# Patient Record
Sex: Male | Born: 1975 | Race: White | Hispanic: No | Marital: Single | Smoking: Former smoker
Health system: Southern US, Community
[De-identification: ages and names within clinical notes are randomized; demographics above are authoritative.]

## PROBLEM LIST (undated history)

## (undated) ENCOUNTER — Encounter

## (undated) ENCOUNTER — Ambulatory Visit

## (undated) ENCOUNTER — Telehealth

## (undated) ENCOUNTER — Encounter: Attending: Physician Assistant | Primary: Physician Assistant

## (undated) ENCOUNTER — Encounter
Attending: Student in an Organized Health Care Education/Training Program | Primary: Student in an Organized Health Care Education/Training Program

## (undated) ENCOUNTER — Encounter
Attending: Pharmacist Clinician (PhC)/ Clinical Pharmacy Specialist | Primary: Pharmacist Clinician (PhC)/ Clinical Pharmacy Specialist

## (undated) ENCOUNTER — Encounter: Attending: Critical Care Medicine | Primary: Critical Care Medicine

## (undated) ENCOUNTER — Ambulatory Visit: Attending: Otolaryngology | Primary: Otolaryngology

## (undated) ENCOUNTER — Telehealth
Attending: Student in an Organized Health Care Education/Training Program | Primary: Student in an Organized Health Care Education/Training Program

## (undated) ENCOUNTER — Telehealth: Attending: Otolaryngology | Primary: Otolaryngology

## (undated) ENCOUNTER — Ambulatory Visit: Attending: Physician Assistant | Primary: Physician Assistant

## (undated) ENCOUNTER — Ambulatory Visit
Attending: Pharmacist Clinician (PhC)/ Clinical Pharmacy Specialist | Primary: Pharmacist Clinician (PhC)/ Clinical Pharmacy Specialist

## (undated) ENCOUNTER — Encounter: Attending: Anesthesiology | Primary: Anesthesiology

## (undated) ENCOUNTER — Telehealth: Attending: Critical Care Medicine | Primary: Critical Care Medicine

## (undated) ENCOUNTER — Ambulatory Visit: Payer: PRIVATE HEALTH INSURANCE

## (undated) ENCOUNTER — Ambulatory Visit: Attending: Pharmacist | Primary: Pharmacist

## (undated) ENCOUNTER — Telehealth: Attending: Urology | Primary: Urology

## (undated) DIAGNOSIS — E119 Type 2 diabetes mellitus without complications: Secondary | ICD-10-CM

## (undated) HISTORY — PX: NASAL SINUS SURGERY: SHX719

---

## 2004-03-14 ENCOUNTER — Emergency Department: Payer: Self-pay | Admitting: Emergency Medicine

## 2004-07-01 ENCOUNTER — Other Ambulatory Visit: Payer: Self-pay

## 2004-07-01 ENCOUNTER — Emergency Department: Payer: Self-pay | Admitting: Emergency Medicine

## 2004-08-23 ENCOUNTER — Inpatient Hospital Stay (HOSPITAL_COMMUNITY): Admission: EM | Admit: 2004-08-23 | Discharge: 2004-08-27 | Payer: Self-pay

## 2005-07-14 ENCOUNTER — Emergency Department: Payer: Self-pay | Admitting: Emergency Medicine

## 2005-07-14 ENCOUNTER — Other Ambulatory Visit: Payer: Self-pay

## 2005-09-26 ENCOUNTER — Emergency Department: Payer: Self-pay | Admitting: Emergency Medicine

## 2006-03-04 ENCOUNTER — Other Ambulatory Visit: Payer: Self-pay

## 2006-03-04 ENCOUNTER — Emergency Department: Payer: Self-pay | Admitting: Emergency Medicine

## 2006-05-05 ENCOUNTER — Other Ambulatory Visit: Payer: Self-pay

## 2006-05-05 ENCOUNTER — Emergency Department: Payer: Self-pay | Admitting: Emergency Medicine

## 2006-08-08 ENCOUNTER — Other Ambulatory Visit: Payer: Self-pay

## 2006-08-08 ENCOUNTER — Emergency Department: Payer: Self-pay

## 2010-11-15 ENCOUNTER — Emergency Department: Payer: Self-pay | Admitting: Emergency Medicine

## 2011-11-16 ENCOUNTER — Ambulatory Visit: Payer: Self-pay | Admitting: Physical Medicine and Rehabilitation

## 2018-05-29 ENCOUNTER — Ambulatory Visit
Admit: 2018-05-29 | Discharge: 2018-05-30 | Attending: Student in an Organized Health Care Education/Training Program | Primary: Student in an Organized Health Care Education/Training Program

## 2018-05-29 DIAGNOSIS — H00019 Hordeolum externum unspecified eye, unspecified eyelid: Principal | ICD-10-CM

## 2018-06-30 ENCOUNTER — Institutional Professional Consult (permissible substitution): Admit: 2018-06-30 | Discharge: 2018-07-01

## 2018-06-30 DIAGNOSIS — E119 Type 2 diabetes mellitus without complications: Secondary | ICD-10-CM

## 2018-06-30 DIAGNOSIS — E1140 Type 2 diabetes mellitus with diabetic neuropathy, unspecified: Secondary | ICD-10-CM

## 2018-06-30 DIAGNOSIS — N489 Disorder of penis, unspecified: Principal | ICD-10-CM

## 2018-07-24 ENCOUNTER — Ambulatory Visit: Admit: 2018-07-24 | Discharge: 2018-07-25 | Attending: Urology | Primary: Urology

## 2018-07-24 DIAGNOSIS — N489 Disorder of penis, unspecified: Principal | ICD-10-CM

## 2018-11-02 ENCOUNTER — Encounter
Admit: 2018-11-02 | Discharge: 2018-11-03 | Disposition: A | Discharge status: Home / Self Care | Payer: Worker's Compensation

## 2018-11-03 MED ORDER — FLUTICASONE PROPIONATE 50 MCG/ACTUATION NASAL SPRAY,SUSPENSION
Freq: Two times a day (BID) | NASAL | 0 refills | 0 days | Status: CP
Start: 2018-11-03 — End: 2019-11-03

## 2018-11-03 MED ORDER — DOXYCYCLINE HYCLATE 100 MG CAPSULE
ORAL_CAPSULE | Freq: Two times a day (BID) | ORAL | 0 refills | 21 days | Status: CP
Start: 2018-11-03 — End: 2018-11-24

## 2018-11-03 MED ORDER — OCEAN NASAL 0.65 % SPRAY AEROSOL
Freq: Once | NASAL | 12 refills | 75.00000 days | Status: CP
Start: 2018-11-03 — End: 2018-11-03

## 2018-11-05 ENCOUNTER — Ambulatory Visit: Admit: 2018-11-05 | Discharge: 2018-11-06

## 2018-11-05 ENCOUNTER — Ambulatory Visit: Admit: 2018-11-05 | Discharge: 2018-11-06 | Attending: Family | Primary: Family

## 2018-11-06 ENCOUNTER — Telehealth: Admit: 2018-11-06 | Discharge: 2018-11-07

## 2018-11-06 DIAGNOSIS — G4733 Obstructive sleep apnea (adult) (pediatric): Principal | ICD-10-CM

## 2018-11-06 DIAGNOSIS — E119 Type 2 diabetes mellitus without complications: Principal | ICD-10-CM

## 2018-11-06 MED ORDER — LIRAGLUTIDE 0.6 MG/0.1 ML (18 MG/3 ML) SUBCUTANEOUS PEN INJECTOR
Freq: Every day | SUBCUTANEOUS | 11 refills | 60.00000 days | Status: CP
Start: 2018-11-06 — End: 2019-11-06
  Filled 2018-11-10: qty 6, 60d supply, fill #0

## 2018-11-06 MED ORDER — PEN NEEDLE, DIABETIC 32 GAUGE X 1/4" (6 MM)
5 refills | 0 days
Start: 2018-11-06 — End: ?

## 2018-11-10 MED FILL — NOVOFINE 32 32 GAUGE X 1/4" NEEDLE (6 MM): 100 days supply | Qty: 100 | Fill #0

## 2018-11-10 MED FILL — VICTOZA 2-PAK 0.6 MG/0.1 ML (18 MG/3 ML) SUBCUTANEOUS PEN INJECTOR: 60 days supply | Qty: 6 | Fill #0 | Status: AC

## 2018-11-10 MED FILL — NOVOFINE 32 32 GAUGE X 1/4" NEEDLE: 100 days supply | Qty: 100 | Fill #0 | Status: AC

## 2018-11-20 ENCOUNTER — Telehealth: Admit: 2018-11-20 | Discharge: 2018-11-21

## 2018-11-20 MED ORDER — MISCELLANEOUS MEDICAL SUPPLY MISC
0 refills | 0 days | Status: CP
Start: 2018-11-20 — End: ?

## 2018-11-21 ENCOUNTER — Telehealth: Admit: 2018-11-21 | Discharge: 2018-11-22 | Attending: Physician Assistant | Primary: Physician Assistant

## 2018-11-26 MED ORDER — ALBUTEROL SULFATE HFA 90 MCG/ACTUATION AEROSOL INHALER
Freq: Four times a day (QID) | RESPIRATORY_TRACT | 5 refills | 0.00000 days | Status: CP | PRN
Start: 2018-11-26 — End: 2019-11-26

## 2018-11-28 ENCOUNTER — Ambulatory Visit: Admit: 2018-11-28 | Discharge: 2018-11-29

## 2018-12-02 MED ORDER — ATORVASTATIN 40 MG TABLET
ORAL_TABLET | Freq: Every day | ORAL | 3 refills | 90 days | Status: CP
Start: 2018-12-02 — End: 2019-12-02

## 2018-12-10 ENCOUNTER — Ambulatory Visit: Admit: 2018-12-10 | Discharge: 2018-12-11 | Attending: Otolaryngology | Primary: Otolaryngology

## 2018-12-10 DIAGNOSIS — J329 Chronic sinusitis, unspecified: Principal | ICD-10-CM

## 2018-12-10 DIAGNOSIS — J309 Allergic rhinitis, unspecified: Principal | ICD-10-CM

## 2018-12-10 MED ORDER — FEXOFENADINE 180 MG TABLET
ORAL_TABLET | Freq: Every day | ORAL | 11 refills | 0 days | Status: CP
Start: 2018-12-10 — End: 2019-12-10

## 2018-12-10 MED ORDER — FLUTICASONE PROPIONATE 50 MCG/ACTUATION NASAL SPRAY,SUSPENSION
Freq: Two times a day (BID) | NASAL | 12 refills | 0.00000 days | Status: CP
Start: 2018-12-10 — End: 2019-01-09

## 2018-12-11 ENCOUNTER — Ambulatory Visit: Admit: 2018-12-11 | Discharge: 2018-12-12 | Attending: Critical Care Medicine | Primary: Critical Care Medicine

## 2018-12-12 ENCOUNTER — Telehealth: Admit: 2018-12-12 | Discharge: 2018-12-13

## 2018-12-12 DIAGNOSIS — F411 Generalized anxiety disorder: Principal | ICD-10-CM

## 2018-12-12 DIAGNOSIS — U071 COVID-19 virus infection: Principal | ICD-10-CM

## 2018-12-12 MED ORDER — BUSPIRONE 5 MG TABLET
ORAL_TABLET | Freq: Three times a day (TID) | ORAL | 0 refills | 30 days | Status: CP | PRN
Start: 2018-12-12 — End: 2019-01-11

## 2018-12-14 ENCOUNTER — Emergency Department
Admission: EM | Admit: 2018-12-14 | Discharge: 2018-12-14 | Disposition: A | Discharge status: Home / Self Care | Payer: Self-pay | Attending: Emergency Medicine | Admitting: Emergency Medicine

## 2018-12-14 ENCOUNTER — Other Ambulatory Visit: Payer: Self-pay

## 2018-12-14 ENCOUNTER — Emergency Department: Payer: Self-pay

## 2018-12-14 DIAGNOSIS — F419 Anxiety disorder, unspecified: Secondary | ICD-10-CM

## 2018-12-14 DIAGNOSIS — R0981 Nasal congestion: Secondary | ICD-10-CM | POA: Insufficient documentation

## 2018-12-14 DIAGNOSIS — U071 COVID-19: Secondary | ICD-10-CM

## 2018-12-14 DIAGNOSIS — E119 Type 2 diabetes mellitus without complications: Secondary | ICD-10-CM | POA: Insufficient documentation

## 2018-12-14 DIAGNOSIS — R5383 Other fatigue: Secondary | ICD-10-CM

## 2018-12-14 DIAGNOSIS — Z87891 Personal history of nicotine dependence: Secondary | ICD-10-CM | POA: Insufficient documentation

## 2018-12-14 HISTORY — DX: Type 2 diabetes mellitus without complications: E11.9

## 2018-12-14 LAB — COMPREHENSIVE METABOLIC PANEL
ALT: 22 U/L (ref 0–44)
AST: 15 U/L (ref 15–41)
Albumin: 4.4 g/dL (ref 3.5–5.0)
Alkaline Phosphatase: 63 U/L (ref 38–126)
Anion gap: 11 (ref 5–15)
BUN: 11 mg/dL (ref 6–20)
CO2: 24 mmol/L (ref 22–32)
Calcium: 9.1 mg/dL (ref 8.9–10.3)
Chloride: 102 mmol/L (ref 98–111)
Creatinine, Ser: 0.62 mg/dL (ref 0.61–1.24)
GFR calc Af Amer: >60 mL/min (ref >60)
GFR calc non Af Amer: >60 mL/min (ref >60)
Glucose, Bld: 180 mg/dL — ABNORMAL HIGH (ref 70–99)
Potassium: 3.5 mmol/L (ref 3.5–5.1)
Sodium: 137 mmol/L (ref 135–145)
Total Bilirubin: 1.2 mg/dL (ref 0.3–1.2)
Total Protein: 7.7 g/dL (ref 6.5–8.1)

## 2018-12-14 LAB — CBC WITH DIFFERENTIAL/PLATELET
Abs Immature Granulocytes: 0.06 10*3/uL (ref 0.00–0.07)
Basophils Absolute: 0.1 10*3/uL (ref 0.0–0.1)
Basophils Relative: 1 %
Eosinophils Absolute: 0.3 10*3/uL (ref 0.0–0.5)
Eosinophils Relative: 3 %
HCT: 42.3 % (ref 39.0–52.0)
Hemoglobin: 14.6 g/dL (ref 13.0–17.0)
Immature Granulocytes: 1 %
Lymphocytes Relative: 27 %
Lymphs Abs: 2.7 10*3/uL (ref 0.7–4.0)
MCH: 27.6 pg (ref 26.0–34.0)
MCHC: 34.5 g/dL (ref 30.0–36.0)
MCV: 80 fL (ref 80.0–100.0)
Monocytes Absolute: 0.6 10*3/uL (ref 0.1–1.0)
Monocytes Relative: 6 %
Neutro Abs: 6.1 10*3/uL (ref 1.7–7.7)
Neutrophils Relative %: 62 %
Platelets: 294 10*3/uL (ref 150–400)
RBC: 5.29 MIL/uL (ref 4.22–5.81)
RDW: 13 % (ref 11.5–15.5)
WBC: 9.8 10*3/uL (ref 4.0–10.5)
nRBC: 0 % (ref 0.0–0.2)

## 2018-12-14 MED ORDER — HYDROXYZINE HCL 25 MG PO TABS
25.0000 mg | ORAL_TABLET | Freq: Once | ORAL | Status: AC
  Administered 2018-12-14: 23:00:00 25 mg via ORAL
  Filled 2018-12-14: qty 1

## 2018-12-14 NOTE — Discharge Instructions (Addendum)
You were seen today for fatigue associated with COVID-19.  Your x-ray was negative for pneumonia.  Your labs were completely normal.  At this point we recommend rest and fluids.  If your symptoms persist you may follow-up with your PCP.  If they worsen we recommend you follow back up in the ER.

## 2018-12-14 NOTE — ED Triage Notes (Signed)
Pt comes EMS with covid positive dx 3 days ago. Pt comes in due to exhaustion. Pt has t2d. VSS with EMS. CBG 108.

## 2018-12-14 NOTE — ED Notes (Signed)
Pt states "man I feel terrible. I feel like I need to be on medicine right now. I need to lower my bp. I feel like I just ran a marathon." This RN explained that it should only be a few minutes before he gets to a room. Pt very impatient and again states "man I feel awful." Pt wheeled to behind the red line in waiting room.

## 2018-12-14 NOTE — ED Provider Notes (Signed)
[facility] Emergency Department Provider Note ____________________________________________  Time seen: 2145  I have reviewed the triage vital signs and the nursing notes.  HISTORY  Chief Complaint  Weakness   HPI Jimmy Atkins is a 43 y.o. male presents to the ER today with complaint of extreme fatigue.  He reports this started suddenly today around 3 PM.  He was diagnosed with Covid 3 days ago.  He reports chest congestion and mild shortness of breath.  He denies headache, runny nose, nasal congestion, ear pain, sore throat or loss of taste or smell.  He denies fever, chills or body aches.  He denies nausea, vomiting, diarrhea or rash. He denies chest pain, chest tightness or unilateral weakness. He called EMS to his home earlier in the day.  They checked his blood sugar which he reports it was 108.  They did not transport him to the ER.  He called EMS a few hours later for extreme fatigue.  He is not sure if this fatigue is coming from Covid, because he is anxious or because he started BuSpar 5 mg 3 times a day yesterday.  Past Medical History:  Diagnosis Date  . Diabetes mellitus without complication ([place])     There are no active problems to display for this patient.   History reviewed. No pertinent surgical history.  Prior to Admission medications   Not on File    Allergies Penicillins  History reviewed. No pertinent family history.  Social History Social History   Tobacco Use  . Smoking status: Former Smoker    Quit date: 2008    Years since quitting: 12.9  . Smokeless tobacco: Never Used  Substance Use Topics  . Alcohol use: Not Currently    Frequency: Never  . Drug use: Never    Review of Systems  Constitutional: Positive for extreme fatigue. Negative for fever, chills or body aches. ENT: Negative for runny nose, nasal congestion, ear pain, sore throat, loss of taste or smell. Cardiovascular: Negative for chest pain or chest  tightness. Respiratory: Positive for cough and SOB. Gastrointestinal: Negative for nausea, vomiting and diarrhea. Skin: Negative for rash. Neurological: Negative for headaches, focal weakness, tingling or numbness. Psych: Positive for anxiety. Denies depression. ____________________________________________  PHYSICAL EXAM:  VITAL SIGNS: ED Triage Vitals  Enc Vitals Group     BP 12/14/18 2042 (!) 157/82     Pulse Rate 12/14/18 2042 89     Resp 12/14/18 2042 18     Temp 12/14/18 2042 98.2 F (36.8 C)     Temp Source 12/14/18 2042 Oral     SpO2 12/14/18 2042 95 %     Weight 12/14/18 2040 (!) 420 lb (190.5 kg)     Height 12/14/18 2040 5\' 9"  (1.753 m)     Head Circumference --      Peak Flow --      Pain Score 12/14/18 2040 0     Pain Loc --      Pain Edu? --      Excl. in [place]? --     Constitutional: Alert and oriented. Obese, in no distress. Head: Normocephalic and atraumatic. Eyes: Conjunctivae are normal. PERRL. Normal extraocular movements Ears: Canals clear. TMs intact bilaterally. Nose: No congestion/rhinorrhea/epistaxis. Mouth/Throat: Mucous membranes are moist. No posterior pharynx erythema or exudate noted. Hematological/Lymphatic/Immunological: No cervical lymphadenopathy. Cardiovascular: Normal rate, regular rhythm.  Respiratory: Normal respiratory effort. No wheezes/rales/rhonchi. Gastrointestinal: Soft and nontender. No distention. Musculoskeletal: Strength 5/5 BUE/BLE. Neurologic:  Normal speech and  language. No gross focal neurologic deficits are appreciated. Skin:  Skin is warm, dry and intact. No rash noted. Psychiatric: Anxious ____________________________________________   LABS Recent Results (from the past 2160 hour(s))  CBC with Differential     Status: None   Collection Time: 12/14/18 10:48 PM  Result Value Ref Range   WBC 9.8 4.0 - 10.5 K/uL   RBC 5.29 4.22 - 5.81 MIL/uL   Hemoglobin 14.6 13.0 - 17.0 g/dL   HCT 64.4 03.4 - 74.2 %   MCV 80.0 80.0  - 100.0 fL   MCH 27.6 26.0 - 34.0 pg   MCHC 34.5 30.0 - 36.0 g/dL   RDW 59.5 63.8 - 75.6 %   Platelets 294 150 - 400 K/uL   nRBC 0.0 0.0 - 0.2 %   Neutrophils Relative % 62 %   Neutro Abs 6.1 1.7 - 7.7 K/uL   Lymphocytes Relative 27 %   Lymphs Abs 2.7 0.7 - 4.0 K/uL   Monocytes Relative 6 %   Monocytes Absolute 0.6 0.1 - 1.0 K/uL   Eosinophils Relative 3 %   Eosinophils Absolute 0.3 0.0 - 0.5 K/uL   Basophils Relative 1 %   Basophils Absolute 0.1 0.0 - 0.1 K/uL   Immature Granulocytes 1 %   Abs Immature Granulocytes 0.06 0.00 - 0.07 K/uL    Comment: Performed at [facility], [address] [place], [place] [address]  Comprehensive metabolic panel     Status: Abnormal   Collection Time: 12/14/18 10:48 PM  Result Value Ref Range   Sodium 137 135 - 145 mmol/L   Potassium 3.5 3.5 - 5.1 mmol/L   Chloride 102 98 - 111 mmol/L   CO2 24 22 - 32 mmol/L   Glucose, Bld 180 (H) 70 - 99 mg/dL   BUN 11 6 - 20 mg/dL   Creatinine, Ser 5.18 0.61 - 1.24 mg/dL   Calcium 9.1 8.9 - 84.1 mg/dL   Total Protein 7.7 6.5 - 8.1 g/dL   Albumin 4.4 3.5 - 5.0 g/dL   AST 15 15 - 41 U/L   ALT 22 0 - 44 U/L   Alkaline Phosphatase 63 38 - 126 U/L   Total Bilirubin 1.2 0.3 - 1.2 mg/dL   GFR calc non Af Amer >60 >60 mL/min   GFR calc Af Amer >60 >60 mL/min   Anion gap 11 5 - 15    Comment: Performed at [facility], [address], [place], [place] [address]    ____________________________________________   RADIOLOGY  Imaging Orders     DG Chest 1 View IMPRESSION:  No active cardiopulmonary disease.    ____________________________________________   INITIAL IMPRESSION / ASSESSMENT AND PLAN / ED COURSE  Fatigue, COVID 19 +, Anxiety:  Chest xray negative Labs unremarkable Hydroxyzine 25 mg PO x 1 in ER due to anxiety Reassurance provided Recommend rest, fluids Discussed self quarantine for 14 days Encouraged masking, frequent handwashing and social distancing even in  the home Follow up with PCP if symptoms persist, ER if symptoms worsen ____________________________________________  FINAL CLINICAL IMPRESSION(S) / ED DIAGNOSES  Final diagnoses:  Other fatigue  COVID-19  Anxiety  Nicki Reaper, NP     Lorre Munroe, NP 12/14/18 2332    Dionne Bucy, MD 12/15/18 [phone]

## 2018-12-14 NOTE — ED Notes (Signed)
ED Provider at bedside. 

## 2018-12-16 MED ORDER — LANCETS
Freq: Every day | 11 refills | 0 days | Status: CP
Start: 2018-12-16 — End: ?

## 2018-12-16 MED ORDER — FREESTYLE LITE STRIPS
ORAL_STRIP | Freq: Once | 11 refills | 0.00000 days | Status: CP
Start: 2018-12-16 — End: 2018-12-16

## 2018-12-20 ENCOUNTER — Encounter
Admit: 2018-12-20 | Discharge: 2018-12-21 | Disposition: A | Discharge status: Home / Self Care | Payer: Worker's Compensation

## 2018-12-20 ENCOUNTER — Ambulatory Visit
Admit: 2018-12-20 | Discharge: 2018-12-21 | Disposition: A | Discharge status: Home / Self Care | Payer: Worker's Compensation

## 2018-12-20 DIAGNOSIS — J189 Pneumonia, unspecified organism: Principal | ICD-10-CM

## 2018-12-20 DIAGNOSIS — R0789 Other chest pain: Principal | ICD-10-CM

## 2018-12-20 MED ORDER — AZITHROMYCIN 250 MG TABLET
ORAL_TABLET | Freq: Every day | ORAL | 0 refills | 6.00000 days | Status: CP
Start: 2018-12-20 — End: ?

## 2018-12-27 ENCOUNTER — Encounter
Admit: 2018-12-27 | Discharge: 2018-12-28 | Disposition: A | Discharge status: Home / Self Care | Payer: Worker's Compensation

## 2019-01-01 ENCOUNTER — Telehealth: Admit: 2019-01-01 | Discharge: 2019-01-02 | Attending: Internal Medicine | Primary: Internal Medicine

## 2019-01-01 DIAGNOSIS — J329 Chronic sinusitis, unspecified: Principal | ICD-10-CM

## 2019-01-01 DIAGNOSIS — E119 Type 2 diabetes mellitus without complications: Principal | ICD-10-CM

## 2019-01-01 MED ORDER — DOXYCYCLINE HYCLATE 100 MG TABLET
ORAL_TABLET | Freq: Two times a day (BID) | ORAL | 0 refills | 10.00000 days | Status: CP
Start: 2019-01-01 — End: 2019-01-11

## 2019-01-12 ENCOUNTER — Telehealth: Admit: 2019-01-12 | Discharge: 2019-01-13

## 2019-01-15 MED FILL — VICTOZA 2-PAK 0.6 MG/0.1 ML (18 MG/3 ML) SUBCUTANEOUS PEN INJECTOR: 60 days supply | Qty: 6 | Fill #0 | Status: AC

## 2019-01-15 MED FILL — VICTOZA 2-PAK 0.6 MG/0.1 ML (18 MG/3 ML) SUBCUTANEOUS PEN INJECTOR: SUBCUTANEOUS | 60 days supply | Qty: 6 | Fill #0

## 2019-01-21 ENCOUNTER — Ambulatory Visit: Admit: 2019-01-21 | Discharge: 2019-01-22 | Attending: Otolaryngology | Primary: Otolaryngology

## 2019-01-21 ENCOUNTER — Ambulatory Visit: Admit: 2019-01-21 | Discharge: 2019-01-22

## 2019-01-21 DIAGNOSIS — E119 Type 2 diabetes mellitus without complications: Principal | ICD-10-CM

## 2019-01-21 DIAGNOSIS — Z6841 Body Mass Index (BMI) 40.0 and over, adult: Secondary | ICD-10-CM

## 2019-01-21 DIAGNOSIS — R030 Elevated blood-pressure reading, without diagnosis of hypertension: Principal | ICD-10-CM

## 2019-01-21 DIAGNOSIS — G4733 Obstructive sleep apnea (adult) (pediatric): Principal | ICD-10-CM

## 2019-01-21 DIAGNOSIS — Z01818 Encounter for other preprocedural examination: Principal | ICD-10-CM

## 2019-01-30 DIAGNOSIS — J329 Chronic sinusitis, unspecified: Principal | ICD-10-CM

## 2019-01-30 DIAGNOSIS — Z6841 Body Mass Index (BMI) 40.0 and over, adult: Principal | ICD-10-CM

## 2019-01-30 DIAGNOSIS — G4733 Obstructive sleep apnea (adult) (pediatric): Principal | ICD-10-CM

## 2019-02-02 MED ORDER — METFORMIN 1,000 MG TABLET
ORAL_TABLET | Freq: Two times a day (BID) | ORAL | 0 refills | 90 days | Status: CP
Start: 2019-02-02 — End: ?

## 2019-02-03 ENCOUNTER — Telehealth: Admit: 2019-02-03 | Discharge: 2019-02-04

## 2019-02-04 ENCOUNTER — Institutional Professional Consult (permissible substitution): Admit: 2019-02-04 | Discharge: 2019-02-05 | Attending: Otolaryngology | Primary: Otolaryngology

## 2019-02-04 DIAGNOSIS — R0981 Nasal congestion: Principal | ICD-10-CM

## 2019-02-05 ENCOUNTER — Ambulatory Visit: Admit: 2019-02-05 | Discharge: 2019-02-05

## 2019-02-09 ENCOUNTER — Ambulatory Visit: Admit: 2019-02-09 | Discharge: 2019-02-10

## 2019-02-10 DIAGNOSIS — J0101 Acute recurrent maxillary sinusitis: Principal | ICD-10-CM

## 2019-02-10 MED ORDER — DOXYCYCLINE HYCLATE 100 MG CAPSULE
ORAL_CAPSULE | Freq: Two times a day (BID) | ORAL | 0 refills | 7.00000 days | Status: CP
Start: 2019-02-10 — End: 2019-02-17
  Filled 2019-02-11: qty 14, 7d supply, fill #0

## 2019-02-10 MED ORDER — BLOOD-GLUCOSE METER
0 refills | 0 days | Status: CP
Start: 2019-02-10 — End: ?

## 2019-02-10 MED ORDER — DOXYCYCLINE HYCLATE 100 MG CAPSULE: 100 mg | capsule | Freq: Two times a day (BID) | 0 refills | 7 days | Status: AC

## 2019-02-11 MED FILL — DOXYCYCLINE HYCLATE 100 MG CAPSULE: 7 days supply | Qty: 14 | Fill #0 | Status: AC

## 2019-02-25 ENCOUNTER — Institutional Professional Consult (permissible substitution): Admit: 2019-02-25 | Discharge: 2019-02-26

## 2019-02-25 MED ORDER — LIRAGLUTIDE 0.6 MG/0.1 ML (18 MG/3 ML) SUBCUTANEOUS PEN INJECTOR
Freq: Every day | SUBCUTANEOUS | 1 refills | 75.00000 days | Status: CP
Start: 2019-02-25 — End: 2020-02-25
  Filled 2019-03-09: qty 18, 90d supply, fill #0

## 2019-03-04 MED ORDER — MOMETASONE FUROATE (BULK) 100 % POWDER
3 refills | 0 days | Status: CP
Start: 2019-03-04 — End: ?

## 2019-03-06 ENCOUNTER — Ambulatory Visit: Admit: 2019-03-06 | Discharge: 2019-03-07 | Attending: Family | Primary: Family

## 2019-03-06 MED ORDER — METFORMIN 1,000 MG TABLET
ORAL_TABLET | Freq: Two times a day (BID) | ORAL | 0 refills | 180.00000 days
Start: 2019-03-06 — End: ?

## 2019-03-08 ENCOUNTER — Ambulatory Visit: Admit: 2019-03-08 | Discharge: 2019-03-10

## 2019-03-09 MED FILL — ULTICARE PEN NEEDLE 32 GAUGE X 1/4" (6 MM): 100 days supply | Qty: 100 | Fill #1

## 2019-03-09 MED FILL — ULTICARE PEN NEEDLE 32 GAUGE X 1/4" (6 MM): 100 days supply | Qty: 100 | Fill #1 | Status: AC

## 2019-03-09 MED FILL — VICTOZA 3-PAK 0.6 MG/0.1 ML (18 MG/3 ML) SUBCUTANEOUS PEN INJECTOR: 90 days supply | Qty: 18 | Fill #0 | Status: AC

## 2019-04-08 DIAGNOSIS — E119 Type 2 diabetes mellitus without complications: Principal | ICD-10-CM

## 2019-05-03 ENCOUNTER — Encounter
Admit: 2019-05-03 | Discharge: 2019-05-03 | Disposition: A | Discharge status: Home / Self Care | Payer: Worker's Compensation | Attending: Emergency Medicine

## 2019-05-03 DIAGNOSIS — N12 Tubulo-interstitial nephritis, not specified as acute or chronic: Principal | ICD-10-CM

## 2019-05-03 MED ORDER — CEPHALEXIN 500 MG CAPSULE
ORAL_CAPSULE | Freq: Four times a day (QID) | ORAL | 0 refills | 10 days | Status: CP
Start: 2019-05-03 — End: 2019-05-13

## 2019-06-03 ENCOUNTER — Ambulatory Visit: Admit: 2019-06-03 | Discharge: 2019-06-03

## 2019-06-03 ENCOUNTER — Ambulatory Visit: Admit: 2019-06-03 | Discharge: 2019-06-03 | Attending: Otolaryngology | Primary: Otolaryngology

## 2019-06-05 DIAGNOSIS — R0981 Nasal congestion: Principal | ICD-10-CM

## 2019-06-05 DIAGNOSIS — J3489 Other specified disorders of nose and nasal sinuses: Principal | ICD-10-CM

## 2019-06-10 DIAGNOSIS — G4733 Obstructive sleep apnea (adult) (pediatric): Principal | ICD-10-CM

## 2019-06-10 DIAGNOSIS — Z6841 Body Mass Index (BMI) 40.0 and over, adult: Principal | ICD-10-CM

## 2019-06-10 DIAGNOSIS — Z01812 Encounter for preprocedural laboratory examination: Principal | ICD-10-CM

## 2019-06-10 DIAGNOSIS — Z20822 Contact with and (suspected) exposure to covid-19: Principal | ICD-10-CM

## 2019-06-10 DIAGNOSIS — E119 Type 2 diabetes mellitus without complications: Principal | ICD-10-CM

## 2019-06-17 ENCOUNTER — Ambulatory Visit: Admit: 2019-06-17 | Discharge: 2019-06-18 | Attending: Family | Primary: Family

## 2019-06-19 ENCOUNTER — Encounter: Admit: 2019-06-19 | Discharge: 2019-06-20

## 2019-06-19 ENCOUNTER — Ambulatory Visit: Admit: 2019-06-19 | Discharge: 2019-06-20

## 2019-06-20 MED ORDER — DOXYCYCLINE HYCLATE 100 MG CAPSULE
ORAL_CAPSULE | Freq: Two times a day (BID) | ORAL | 0 refills | 14 days | Status: CP
Start: 2019-06-20 — End: 2019-07-04
  Filled 2019-06-20: qty 28, 14d supply, fill #0

## 2019-06-20 MED ORDER — OXYCODONE-ACETAMINOPHEN 5 MG-325 MG TABLET
ORAL_TABLET | ORAL | 0 refills | 3.00000 days | Status: CP | PRN
Start: 2019-06-20 — End: 2019-06-25
  Filled 2019-06-20: qty 15, 3d supply, fill #0

## 2019-06-20 MED FILL — OXYCODONE-ACETAMINOPHEN 5 MG-325 MG TABLET: 3 days supply | Qty: 15 | Fill #0 | Status: AC

## 2019-06-20 MED FILL — DOXYCYCLINE HYCLATE 100 MG CAPSULE: 14 days supply | Qty: 28 | Fill #0 | Status: AC

## 2019-06-22 DIAGNOSIS — Z09 Encounter for follow-up examination after completed treatment for conditions other than malignant neoplasm: Principal | ICD-10-CM

## 2019-06-24 ENCOUNTER — Ambulatory Visit: Admit: 2019-06-24 | Discharge: 2019-06-25 | Attending: Otolaryngology | Primary: Otolaryngology

## 2019-07-07 MED ORDER — LIRAGLUTIDE 0.6 MG/0.1 ML (18 MG/3 ML) SUBCUTANEOUS PEN INJECTOR
Freq: Every day | SUBCUTANEOUS | 1 refills | 90.00000 days | Status: CP
Start: 2019-07-07 — End: 2020-07-06
  Filled 2019-07-20: qty 6, 30d supply, fill #0

## 2019-07-08 DIAGNOSIS — E119 Type 2 diabetes mellitus without complications: Principal | ICD-10-CM

## 2019-07-09 ENCOUNTER — Ambulatory Visit: Admit: 2019-07-09 | Discharge: 2019-07-10

## 2019-07-14 ENCOUNTER — Ambulatory Visit: Admit: 2019-07-14 | Discharge: 2019-07-15

## 2019-07-14 DIAGNOSIS — R300 Dysuria: Principal | ICD-10-CM

## 2019-07-14 DIAGNOSIS — E119 Type 2 diabetes mellitus without complications: Principal | ICD-10-CM

## 2019-07-14 DIAGNOSIS — K625 Hemorrhage of anus and rectum: Principal | ICD-10-CM

## 2019-07-14 MED ORDER — ALBUTEROL SULFATE HFA 90 MCG/ACTUATION AEROSOL INHALER
Freq: Four times a day (QID) | RESPIRATORY_TRACT | 0 refills | 0.00000 days | Status: CP | PRN
Start: 2019-07-14 — End: 2020-07-13
  Filled 2019-07-20: qty 8.5, 25d supply, fill #0

## 2019-07-14 MED ORDER — METFORMIN 1,000 MG TABLET
ORAL_TABLET | Freq: Two times a day (BID) | ORAL | 3 refills | 90.00000 days | Status: CP
Start: 2019-07-14 — End: 2020-07-13
  Filled 2019-07-20: qty 60, 30d supply, fill #0

## 2019-07-17 MED ORDER — PEG-ELECTROLYTE SOLUTION 420 GRAM ORAL SOLUTION
0 refills | 0.00000 days | Status: CP
Start: 2019-07-17 — End: ?

## 2019-07-20 MED FILL — VICTOZA 2-PAK 0.6 MG/0.1 ML (18 MG/3 ML) SUBCUTANEOUS PEN INJECTOR: 30 days supply | Qty: 6 | Fill #0 | Status: AC

## 2019-07-20 MED FILL — ALBUTEROL SULFATE HFA 90 MCG/ACTUATION AEROSOL INHALER: 25 days supply | Qty: 8 | Fill #0 | Status: AC

## 2019-07-20 MED FILL — METFORMIN 1,000 MG TABLET: 30 days supply | Qty: 60 | Fill #0 | Status: AC

## 2019-07-21 MED FILL — PEG-ELECTROLYTE SOLUTION 420 GRAM ORAL SOLUTION: 1 days supply | Qty: 4000 | Fill #0 | Status: AC

## 2019-07-21 MED FILL — NOVOFINE 32 32 GAUGE X 1/4" NEEDLE (6 MM): 100 days supply | Qty: 100 | Fill #0 | Status: AC

## 2019-07-21 MED FILL — PEG-ELECTROLYTE SOLUTION 420 GRAM ORAL SOLUTION: 1 days supply | Qty: 4000 | Fill #0

## 2019-07-21 MED FILL — NOVOFINE 32 32 GAUGE X 1/4" NEEDLE (6 MM): 100 days supply | Qty: 100 | Fill #0

## 2019-07-29 ENCOUNTER — Telehealth: Admit: 2019-07-29 | Discharge: 2019-07-30 | Attending: Physician Assistant | Primary: Physician Assistant

## 2019-08-22 MED FILL — VICTOZA 2-PAK 0.6 MG/0.1 ML (18 MG/3 ML) SUBCUTANEOUS PEN INJECTOR: 30 days supply | Qty: 6 | Fill #1 | Status: AC

## 2019-08-22 MED FILL — VICTOZA 2-PAK 0.6 MG/0.1 ML (18 MG/3 ML) SUBCUTANEOUS PEN INJECTOR: SUBCUTANEOUS | 30 days supply | Qty: 6 | Fill #1

## 2019-09-09 ENCOUNTER — Ambulatory Visit: Admit: 2019-09-09 | Discharge: 2019-09-10

## 2019-09-22 ENCOUNTER — Encounter: Admit: 2019-09-22 | Discharge: 2019-09-22 | Attending: Certified Registered" | Primary: Certified Registered"

## 2019-09-22 ENCOUNTER — Ambulatory Visit: Admit: 2019-09-22 | Discharge: 2019-09-22

## 2019-09-22 MED ORDER — PEG-ELECTROLYTE SOLUTION 420 GRAM ORAL SOLUTION
Freq: Once | ORAL | 0 refills | 1.00000 days | Status: CP
Start: 2019-09-22 — End: 2019-09-22

## 2019-09-25 MED FILL — PEG-ELECTROLYTE SOLUTION 420 GRAM ORAL SOLUTION: 1 days supply | Qty: 4000 | Fill #0 | Status: AC

## 2019-09-25 MED FILL — PEG-ELECTROLYTE SOLUTION 420 GRAM ORAL SOLUTION: ORAL | 1 days supply | Qty: 4000 | Fill #0

## 2019-09-28 MED FILL — VICTOZA 2-PAK 0.6 MG/0.1 ML (18 MG/3 ML) SUBCUTANEOUS PEN INJECTOR: SUBCUTANEOUS | 30 days supply | Qty: 6 | Fill #2

## 2019-09-28 MED FILL — VICTOZA 2-PAK 0.6 MG/0.1 ML (18 MG/3 ML) SUBCUTANEOUS PEN INJECTOR: 30 days supply | Qty: 6 | Fill #2 | Status: AC

## 2019-10-01 MED FILL — METFORMIN 1,000 MG TABLET: 90 days supply | Qty: 180 | Fill #0 | Status: AC

## 2019-10-01 MED FILL — METFORMIN 1,000 MG TABLET: ORAL | 90 days supply | Qty: 180 | Fill #0

## 2019-10-13 ENCOUNTER — Ambulatory Visit: Admit: 2019-10-13 | Discharge: 2019-10-14

## 2019-10-13 DIAGNOSIS — R0600 Dyspnea, unspecified: Principal | ICD-10-CM

## 2019-10-13 DIAGNOSIS — J4520 Mild intermittent asthma, uncomplicated: Principal | ICD-10-CM

## 2019-10-13 DIAGNOSIS — E119 Type 2 diabetes mellitus without complications: Principal | ICD-10-CM

## 2019-10-13 DIAGNOSIS — J329 Chronic sinusitis, unspecified: Principal | ICD-10-CM

## 2019-10-13 MED ORDER — MOMETASONE FUROATE (BULK) 100 % POWDER
9 refills | 0 days | Status: CP
Start: 2019-10-13 — End: ?

## 2019-10-13 MED ORDER — ALBUTEROL SULFATE HFA 90 MCG/ACTUATION AEROSOL INHALER
Freq: Four times a day (QID) | RESPIRATORY_TRACT | 3 refills | 25.00000 days | Status: CP | PRN
Start: 2019-10-13 — End: 2020-10-12
  Filled 2019-10-23: qty 8.5, 25d supply, fill #0

## 2019-10-23 MED FILL — ALBUTEROL SULFATE HFA 90 MCG/ACTUATION AEROSOL INHALER: 25 days supply | Qty: 8 | Fill #0 | Status: AC

## 2019-11-04 MED FILL — VICTOZA 2-PAK 0.6 MG/0.1 ML (18 MG/3 ML) SUBCUTANEOUS PEN INJECTOR: 30 days supply | Qty: 6 | Fill #3 | Status: AC

## 2019-11-04 MED FILL — VICTOZA 2-PAK 0.6 MG/0.1 ML (18 MG/3 ML) SUBCUTANEOUS PEN INJECTOR: SUBCUTANEOUS | 30 days supply | Qty: 6 | Fill #3

## 2019-11-12 MED ORDER — PEN NEEDLE, DIABETIC 32 GAUGE X 1/4" (6 MM)
5 refills | 0 days | Status: CP
Start: 2019-11-12 — End: ?

## 2019-11-16 MED FILL — ULTICARE PEN NEEDLE 32 GAUGE X 1/4" (6 MM): 100 days supply | Qty: 100 | Fill #0

## 2019-11-16 MED FILL — ULTICARE PEN NEEDLE 32 GAUGE X 1/4" (6 MM): 100 days supply | Qty: 100 | Fill #0 | Status: AC

## 2019-11-21 MED ORDER — LIRAGLUTIDE 0.6 MG/0.1 ML (18 MG/3 ML) SUBCUTANEOUS PEN INJECTOR
Freq: Every day | SUBCUTANEOUS | 1 refills | 90.00000 days | Status: CP
Start: 2019-11-21 — End: 2019-11-23

## 2019-11-23 MED ORDER — LIRAGLUTIDE 0.6 MG/0.1 ML (18 MG/3 ML) SUBCUTANEOUS PEN INJECTOR
Freq: Every day | SUBCUTANEOUS | 1 refills | 90.00000 days | Status: CP
Start: 2019-11-23 — End: 2019-12-07
  Filled 2019-11-30: qty 6, 30d supply, fill #0

## 2019-11-30 MED FILL — VICTOZA 2-PAK 0.6 MG/0.1 ML (18 MG/3 ML) SUBCUTANEOUS PEN INJECTOR: 30 days supply | Qty: 6 | Fill #0 | Status: AC

## 2019-12-07 MED ORDER — DULAGLUTIDE 0.75 MG/0.5 ML SUBCUTANEOUS PEN INJECTOR
SUBCUTANEOUS | 3 refills | 84 days | Status: CP
Start: 2019-12-07 — End: 2020-03-06
  Filled 2020-01-05: qty 2, 28d supply, fill #0

## 2020-01-05 MED FILL — TRULICITY 0.75 MG/0.5 ML SUBCUTANEOUS PEN INJECTOR: 28 days supply | Qty: 2 | Fill #0 | Status: AC

## 2020-03-10 MED ORDER — DULAGLUTIDE 0.75 MG/0.5 ML SUBCUTANEOUS PEN INJECTOR
SUBCUTANEOUS | 3 refills | 84.00000 days | Status: CP
Start: 2020-03-10 — End: 2020-03-10
  Filled 2020-03-11: qty 2, 28d supply, fill #0

## 2020-03-10 MED FILL — METFORMIN 1,000 MG TABLET: ORAL | 90 days supply | Qty: 180 | Fill #1

## 2020-03-10 MED FILL — ALBUTEROL SULFATE HFA 90 MCG/ACTUATION AEROSOL INHALER: RESPIRATORY_TRACT | 25 days supply | Qty: 8.5 | Fill #0

## 2020-04-19 MED FILL — ALBUTEROL SULFATE HFA 90 MCG/ACTUATION AEROSOL INHALER: RESPIRATORY_TRACT | 25 days supply | Qty: 8.5 | Fill #1

## 2020-04-19 MED FILL — TRULICITY 0.75 MG/0.5 ML SUBCUTANEOUS PEN INJECTOR: SUBCUTANEOUS | 28 days supply | Qty: 2 | Fill #0

## 2020-04-29 ENCOUNTER — Ambulatory Visit: Admit: 2020-04-29

## 2020-06-28 ENCOUNTER — Ambulatory Visit: Admit: 2020-06-28 | Discharge: 2020-06-29

## 2020-06-28 DIAGNOSIS — E119 Type 2 diabetes mellitus without complications: Principal | ICD-10-CM

## 2020-06-28 DIAGNOSIS — J4520 Mild intermittent asthma, uncomplicated: Principal | ICD-10-CM

## 2020-06-28 DIAGNOSIS — Z1159 Encounter for screening for other viral diseases: Principal | ICD-10-CM

## 2020-06-28 DIAGNOSIS — R5383 Other fatigue: Principal | ICD-10-CM

## 2020-06-28 DIAGNOSIS — Z114 Encounter for screening for human immunodeficiency virus [HIV]: Principal | ICD-10-CM

## 2020-06-28 MED ORDER — TRULICITY 1.5 MG/0.5 ML SUBCUTANEOUS PEN INJECTOR
SUBCUTANEOUS | 3 refills | 84.00000 days | Status: CP
Start: 2020-06-28 — End: 2021-06-28
  Filled 2020-06-28: qty 2, 28d supply, fill #0

## 2020-06-28 MED ORDER — METFORMIN 1,000 MG TABLET
ORAL_TABLET | Freq: Two times a day (BID) | ORAL | 3 refills | 90.00000 days | Status: CP
Start: 2020-06-28 — End: 2021-06-28
  Filled 2020-06-28: qty 60, 30d supply, fill #0

## 2020-06-28 MED ORDER — PROAIR HFA 90 MCG/ACTUATION AEROSOL INHALER
Freq: Four times a day (QID) | RESPIRATORY_TRACT | 6 refills | 25.00000 days | Status: CP | PRN
Start: 2020-06-28 — End: 2021-06-28
  Filled 2020-06-28: qty 8.5, 25d supply, fill #0

## 2020-07-25 ENCOUNTER — Ambulatory Visit: Admit: 2020-07-25 | Discharge: 2020-07-26

## 2020-07-25 MED FILL — TRULICITY 1.5 MG/0.5 ML SUBCUTANEOUS PEN INJECTOR: SUBCUTANEOUS | 28 days supply | Qty: 2 | Fill #1

## 2020-08-05 ENCOUNTER — Institutional Professional Consult (permissible substitution)
Admit: 2020-08-05 | Discharge: 2020-08-06 | Attending: Pharmacist Clinician (PhC)/ Clinical Pharmacy Specialist | Primary: Pharmacist Clinician (PhC)/ Clinical Pharmacy Specialist

## 2020-08-05 MED ORDER — TRULICITY 3 MG/0.5 ML SUBCUTANEOUS PEN INJECTOR
SUBCUTANEOUS | 1 refills | 28 days | Status: CP
Start: 2020-08-05 — End: ?
  Filled 2020-08-25: qty 2, 28d supply, fill #0

## 2020-08-22 ENCOUNTER — Ambulatory Visit: Admit: 2020-08-22 | Discharge: 2020-08-23

## 2020-08-25 MED FILL — METFORMIN 1,000 MG TABLET: ORAL | 90 days supply | Qty: 180 | Fill #0

## 2020-08-25 MED FILL — PROAIR HFA 90 MCG/ACTUATION AEROSOL INHALER: RESPIRATORY_TRACT | 25 days supply | Qty: 8.5 | Fill #0

## 2020-08-28 ENCOUNTER — Ambulatory Visit
Admit: 2020-08-28 | Discharge: 2020-08-28 | Discharge status: Against Medical Advice | Payer: Worker's Compensation | Attending: Emergency Medicine

## 2020-08-28 ENCOUNTER — Emergency Department
Admit: 2020-08-28 | Discharge: 2020-08-28 | Discharge status: Against Medical Advice | Payer: Worker's Compensation | Attending: Emergency Medicine

## 2020-08-28 DIAGNOSIS — T7840XA Allergy, unspecified, initial encounter: Principal | ICD-10-CM

## 2020-08-28 DIAGNOSIS — J45909 Unspecified asthma, uncomplicated: Principal | ICD-10-CM

## 2020-08-28 MED ORDER — DIPHENHYDRAMINE 25 MG CAPSULE
ORAL_CAPSULE | Freq: Four times a day (QID) | ORAL | 0 refills | 3 days | Status: CP | PRN
Start: 2020-08-28 — End: ?

## 2020-08-30 ENCOUNTER — Institutional Professional Consult (permissible substitution): Admit: 2020-08-30 | Discharge: 2020-08-31

## 2020-08-30 MED ORDER — TESTOSTERONE 1 % (50 MG/5 GRAM) TRANSDERMAL GEL PACKET
PACK | Freq: Every day | TRANSDERMAL | 0 refills | 60.00000 days | Status: CP
Start: 2020-08-30 — End: 2020-10-29

## 2020-08-31 DIAGNOSIS — R7989 Other specified abnormal findings of blood chemistry: Principal | ICD-10-CM

## 2020-08-31 MED ORDER — TESTOSTERONE 20.25 MG/1.25 GRAM (1.62 %) TRANSDERMAL GEL PUMP
Freq: Every day | TRANSDERMAL | 0 refills | 0.00000 days | Status: CP
Start: 2020-08-31 — End: 2020-08-31

## 2020-08-31 MED ORDER — TESTOSTERONE 1 % (50 MG/5 GRAM) TRANSDERMAL GEL PACKET
PACK | Freq: Every day | TRANSDERMAL | 0 refills | 60.00000 days | Status: CP
Start: 2020-08-31 — End: 2020-10-30
  Filled 2020-08-31: qty 30, 30d supply, fill #0

## 2020-09-05 ENCOUNTER — Other Ambulatory Visit: Payer: Self-pay

## 2020-09-05 ENCOUNTER — Ambulatory Visit (HOSPITAL_COMMUNITY): Admission: RE | Admit: 2020-09-05 | Payer: Self-pay | Source: Ambulatory Visit

## 2020-09-05 ENCOUNTER — Emergency Department
Admission: EM | Admit: 2020-09-05 | Discharge: 2020-09-05 | Discharge status: Against Medical Advice | Payer: Self-pay | Attending: Emergency Medicine | Admitting: Emergency Medicine

## 2020-09-05 ENCOUNTER — Emergency Department (HOSPITAL_COMMUNITY): Admission: RE | Admit: 2020-09-05 | Payer: Self-pay | Source: Ambulatory Visit

## 2020-09-05 DIAGNOSIS — Z5321 Procedure and treatment not carried out due to patient leaving prior to being seen by health care provider: Secondary | ICD-10-CM | POA: Insufficient documentation

## 2020-09-05 NOTE — ED Triage Notes (Signed)
Patient stated he has had sinus Sx and he gets mucus clogged and it needs cleaned out.

## 2020-09-22 ENCOUNTER — Ambulatory Visit: Admit: 2020-09-22 | Discharge: 2020-09-23

## 2020-09-22 MED ORDER — SYRINGE WITH NEEDLE 1 ML 22 GAUGE X 1 1/2"
1 refills | 0 days | Status: CP
Start: 2020-09-22 — End: ?
  Filled 2020-11-21: qty 4, 28d supply, fill #0

## 2020-09-22 MED ORDER — SILDENAFIL 50 MG TABLET
ORAL_TABLET | Freq: Every day | ORAL | 0 refills | 10 days | Status: CP | PRN
Start: 2020-09-22 — End: 2020-10-22

## 2020-09-22 MED ORDER — TESTOSTERONE CYPIONATE 200 MG/ML INTRAMUSCULAR OIL
INTRAMUSCULAR | 3 refills | 56.00000 days | Status: CP
Start: 2020-09-22 — End: ?
  Filled 2020-09-22: qty 4, 56d supply, fill #0

## 2020-09-26 MED FILL — TRULICITY 3 MG/0.5 ML SUBCUTANEOUS PEN INJECTOR: SUBCUTANEOUS | 28 days supply | Qty: 2 | Fill #1

## 2020-09-26 MED FILL — PROAIR HFA 90 MCG/ACTUATION AEROSOL INHALER: RESPIRATORY_TRACT | 25 days supply | Qty: 8.5 | Fill #1

## 2020-10-14 ENCOUNTER — Ambulatory Visit
Admit: 2020-10-14 | Attending: Pharmacist Clinician (PhC)/ Clinical Pharmacy Specialist | Primary: Pharmacist Clinician (PhC)/ Clinical Pharmacy Specialist

## 2020-10-19 MED FILL — TESTOSTERONE CYPIONATE 200 MG/ML INTRAMUSCULAR OIL: INTRAMUSCULAR | 28 days supply | Qty: 4 | Fill #1

## 2020-10-28 ENCOUNTER — Ambulatory Visit
Admit: 2020-10-28 | Attending: Pharmacist Clinician (PhC)/ Clinical Pharmacy Specialist | Primary: Pharmacist Clinician (PhC)/ Clinical Pharmacy Specialist

## 2020-11-03 MED ORDER — TRULICITY 3 MG/0.5 ML SUBCUTANEOUS PEN INJECTOR
SUBCUTANEOUS | 1 refills | 28 days | Status: CP
Start: 2020-11-03 — End: ?
  Filled 2020-11-07: qty 2, 28d supply, fill #0

## 2020-11-21 MED ORDER — BD LUER-LOK SYRINGE 1 ML
0 refills | 0 days
Start: 2020-11-21 — End: 2020-11-21

## 2020-11-21 MED ORDER — BD REGULAR BEVEL NEEDLES 22 GAUGE X 1 1/2"
0 refills | 0 days
Start: 2020-11-21 — End: ?

## 2020-11-21 MED ORDER — BD LUER-LOK SYRINGE 3 ML 25 X 1 1/2"
0 refills | 0 days
Start: 2020-11-21 — End: ?

## 2020-11-21 MED FILL — TESTOSTERONE CYPIONATE 200 MG/ML INTRAMUSCULAR OIL: INTRAMUSCULAR | 28 days supply | Qty: 4 | Fill #2

## 2020-11-21 MED FILL — BD REGULAR BEVEL NEEDLES 22 GAUGE X 1 1/2": 28 days supply | Qty: 4 | Fill #0

## 2020-12-16 MED ORDER — BD REGULAR BEVEL NEEDLES 18 GAUGE X 1 1/2"
0 refills | 0 days
Start: 2020-12-16 — End: ?

## 2020-12-16 MED FILL — TESTOSTERONE CYPIONATE 200 MG/ML INTRAMUSCULAR OIL: INTRAMUSCULAR | 28 days supply | Qty: 4 | Fill #3

## 2020-12-16 MED FILL — BD LUER-LOK SYRINGE 3 ML 25 X 1 1/2": 28 days supply | Qty: 4 | Fill #1

## 2020-12-16 MED FILL — BD REGULAR BEVEL NEEDLES 18 GAUGE X 1 1/2": 28 days supply | Qty: 4 | Fill #0

## 2020-12-19 MED FILL — TRULICITY 3 MG/0.5 ML SUBCUTANEOUS PEN INJECTOR: SUBCUTANEOUS | 28 days supply | Qty: 2 | Fill #1

## 2021-01-06 ENCOUNTER — Ambulatory Visit: Admit: 2021-01-06 | Discharge: 2021-01-06

## 2021-01-06 DIAGNOSIS — E119 Type 2 diabetes mellitus without complications: Principal | ICD-10-CM

## 2021-01-06 DIAGNOSIS — J189 Pneumonia, unspecified organism: Principal | ICD-10-CM

## 2021-01-06 MED ORDER — DULAGLUTIDE 4.5 MG/0.5 ML SUBCUTANEOUS PEN INJECTOR
SUBCUTANEOUS | 3 refills | 0.00000 days | Status: CP
Start: 2021-01-06 — End: 2021-01-06

## 2021-01-06 MED ORDER — DOXYCYCLINE HYCLATE 100 MG CAPSULE
ORAL_CAPSULE | Freq: Two times a day (BID) | ORAL | 0 refills | 5.00000 days | Status: CP
Start: 2021-01-06 — End: 2021-01-11
  Filled 2021-01-06: qty 10, 5d supply, fill #0

## 2021-01-11 DIAGNOSIS — E291 Testicular hypofunction: Principal | ICD-10-CM

## 2021-01-11 DIAGNOSIS — R7989 Other specified abnormal findings of blood chemistry: Principal | ICD-10-CM

## 2021-01-17 ENCOUNTER — Ambulatory Visit: Admit: 2021-01-17 | Discharge: 2021-01-18

## 2021-01-19 DIAGNOSIS — E291 Testicular hypofunction: Principal | ICD-10-CM

## 2021-01-19 DIAGNOSIS — R7989 Other specified abnormal findings of blood chemistry: Principal | ICD-10-CM

## 2021-01-19 MED ORDER — TESTOSTERONE CYPIONATE 200 MG/ML INTRAMUSCULAR OIL
INTRAMUSCULAR | 5 refills | 56 days | Status: CP
Start: 2021-01-19 — End: ?
  Filled 2021-01-23: qty 4, 56d supply, fill #0

## 2021-01-24 DIAGNOSIS — E291 Testicular hypofunction: Principal | ICD-10-CM

## 2021-01-24 MED ORDER — BD REGULAR BEVEL NEEDLES 22 GAUGE X 1 1/2"
3 refills | 0 days | Status: CP
Start: 2021-01-24 — End: ?

## 2021-01-24 MED ORDER — SYRINGE WITH NEEDLE 3 ML 18 X 1 1/2"
3 refills | 0 days | Status: CP
Start: 2021-01-24 — End: ?
  Filled 2021-02-03: qty 4, 28d supply, fill #0

## 2021-01-27 ENCOUNTER — Ambulatory Visit: Admit: 2021-01-27 | Discharge: 2021-01-28

## 2021-01-27 DIAGNOSIS — J189 Pneumonia, unspecified organism: Principal | ICD-10-CM

## 2021-01-27 DIAGNOSIS — E119 Type 2 diabetes mellitus without complications: Principal | ICD-10-CM

## 2021-01-27 MED ORDER — ERYTHROMYCIN 5 MG/GRAM (0.5 %) EYE OINTMENT
Freq: Three times a day (TID) | OPHTHALMIC | 0 refills | 0 days | Status: CP
Start: 2021-01-27 — End: ?
  Filled 2021-01-27: qty 3.5, 7d supply, fill #0

## 2021-01-27 MED ORDER — DOXYCYCLINE HYCLATE 100 MG TABLET
ORAL_TABLET | Freq: Two times a day (BID) | ORAL | 0 refills | 7 days | Status: CP
Start: 2021-01-27 — End: 2021-02-03
  Filled 2021-01-27: qty 14, 7d supply, fill #0

## 2021-01-27 MED ORDER — DULAGLUTIDE 4.5 MG/0.5 ML SUBCUTANEOUS PEN INJECTOR
SUBCUTANEOUS | 3 refills | 0 days | Status: CP
Start: 2021-01-27 — End: 2022-01-27

## 2021-02-03 MED FILL — BD REGULAR BEVEL NEEDLES 22 GAUGE X 1 1/2": 28 days supply | Qty: 4 | Fill #0

## 2021-02-22 ENCOUNTER — Ambulatory Visit: Admit: 2021-02-22

## 2021-03-01 ENCOUNTER — Ambulatory Visit
Admit: 2021-03-01 | Discharge: 2021-03-02 | Attending: Student in an Organized Health Care Education/Training Program | Primary: Student in an Organized Health Care Education/Training Program

## 2021-03-01 DIAGNOSIS — E119 Type 2 diabetes mellitus without complications: Principal | ICD-10-CM

## 2021-03-01 DIAGNOSIS — L821 Other seborrheic keratosis: Principal | ICD-10-CM

## 2021-03-01 DIAGNOSIS — L989 Disorder of the skin and subcutaneous tissue, unspecified: Principal | ICD-10-CM

## 2021-03-01 MED ORDER — METFORMIN 1,000 MG TABLET
ORAL_TABLET | Freq: Two times a day (BID) | ORAL | 3 refills | 90 days | Status: CP
Start: 2021-03-01 — End: 2022-03-01
  Filled 2021-03-30: qty 60, 30d supply, fill #0

## 2021-03-20 ENCOUNTER — Telehealth
Admit: 2021-03-20 | Discharge: 2021-03-21 | Attending: Student in an Organized Health Care Education/Training Program | Primary: Student in an Organized Health Care Education/Training Program

## 2021-03-20 DIAGNOSIS — R059 Cough, unspecified type: Principal | ICD-10-CM

## 2021-03-21 ENCOUNTER — Ambulatory Visit: Admit: 2021-03-21

## 2021-03-29 ENCOUNTER — Ambulatory Visit: Admit: 2021-03-29 | Discharge: 2021-03-29

## 2021-03-29 ENCOUNTER — Ambulatory Visit
Admit: 2021-03-29 | Discharge: 2021-03-29 | Attending: Student in an Organized Health Care Education/Training Program | Primary: Student in an Organized Health Care Education/Training Program

## 2021-03-29 DIAGNOSIS — Z711 Person with feared health complaint in whom no diagnosis is made: Principal | ICD-10-CM

## 2021-03-29 DIAGNOSIS — J029 Acute pharyngitis, unspecified: Principal | ICD-10-CM

## 2021-03-30 ENCOUNTER — Ambulatory Visit: Admit: 2021-03-30 | Discharge: 2021-03-31

## 2021-03-30 DIAGNOSIS — J020 Streptococcal pharyngitis: Principal | ICD-10-CM

## 2021-03-30 DIAGNOSIS — R7989 Other specified abnormal findings of blood chemistry: Principal | ICD-10-CM

## 2021-03-30 DIAGNOSIS — N529 Male erectile dysfunction, unspecified: Principal | ICD-10-CM

## 2021-03-30 DIAGNOSIS — Z125 Encounter for screening for malignant neoplasm of prostate: Principal | ICD-10-CM

## 2021-03-30 DIAGNOSIS — E291 Testicular hypofunction: Principal | ICD-10-CM

## 2021-03-30 MED ORDER — BD LUER-LOK SYRINGE 3 ML 25 X 1 1/2"
5 refills | 0 days | Status: CP
Start: 2021-03-30 — End: ?

## 2021-03-30 MED ORDER — AMOXICILLIN 500 MG CAPSULE
ORAL_CAPSULE | Freq: Two times a day (BID) | ORAL | 0 refills | 10 days | Status: CP
Start: 2021-03-30 — End: 2021-04-09
  Filled 2021-03-30: qty 20, 10d supply, fill #0

## 2021-04-05 DIAGNOSIS — E291 Testicular hypofunction: Principal | ICD-10-CM

## 2021-04-05 DIAGNOSIS — R7989 Other specified abnormal findings of blood chemistry: Principal | ICD-10-CM

## 2021-04-05 IMAGING — DX DG CHEST 1V
1 series · 1 of 1 positions shown · non-contrast
Comparison: Chest radiograph dated 08/08/2006.

CLINICAL DATA: 43-year-old male with positive [id number] and extreme
fatigue.

EXAM:
CHEST  1 VIEW

[chest ap]
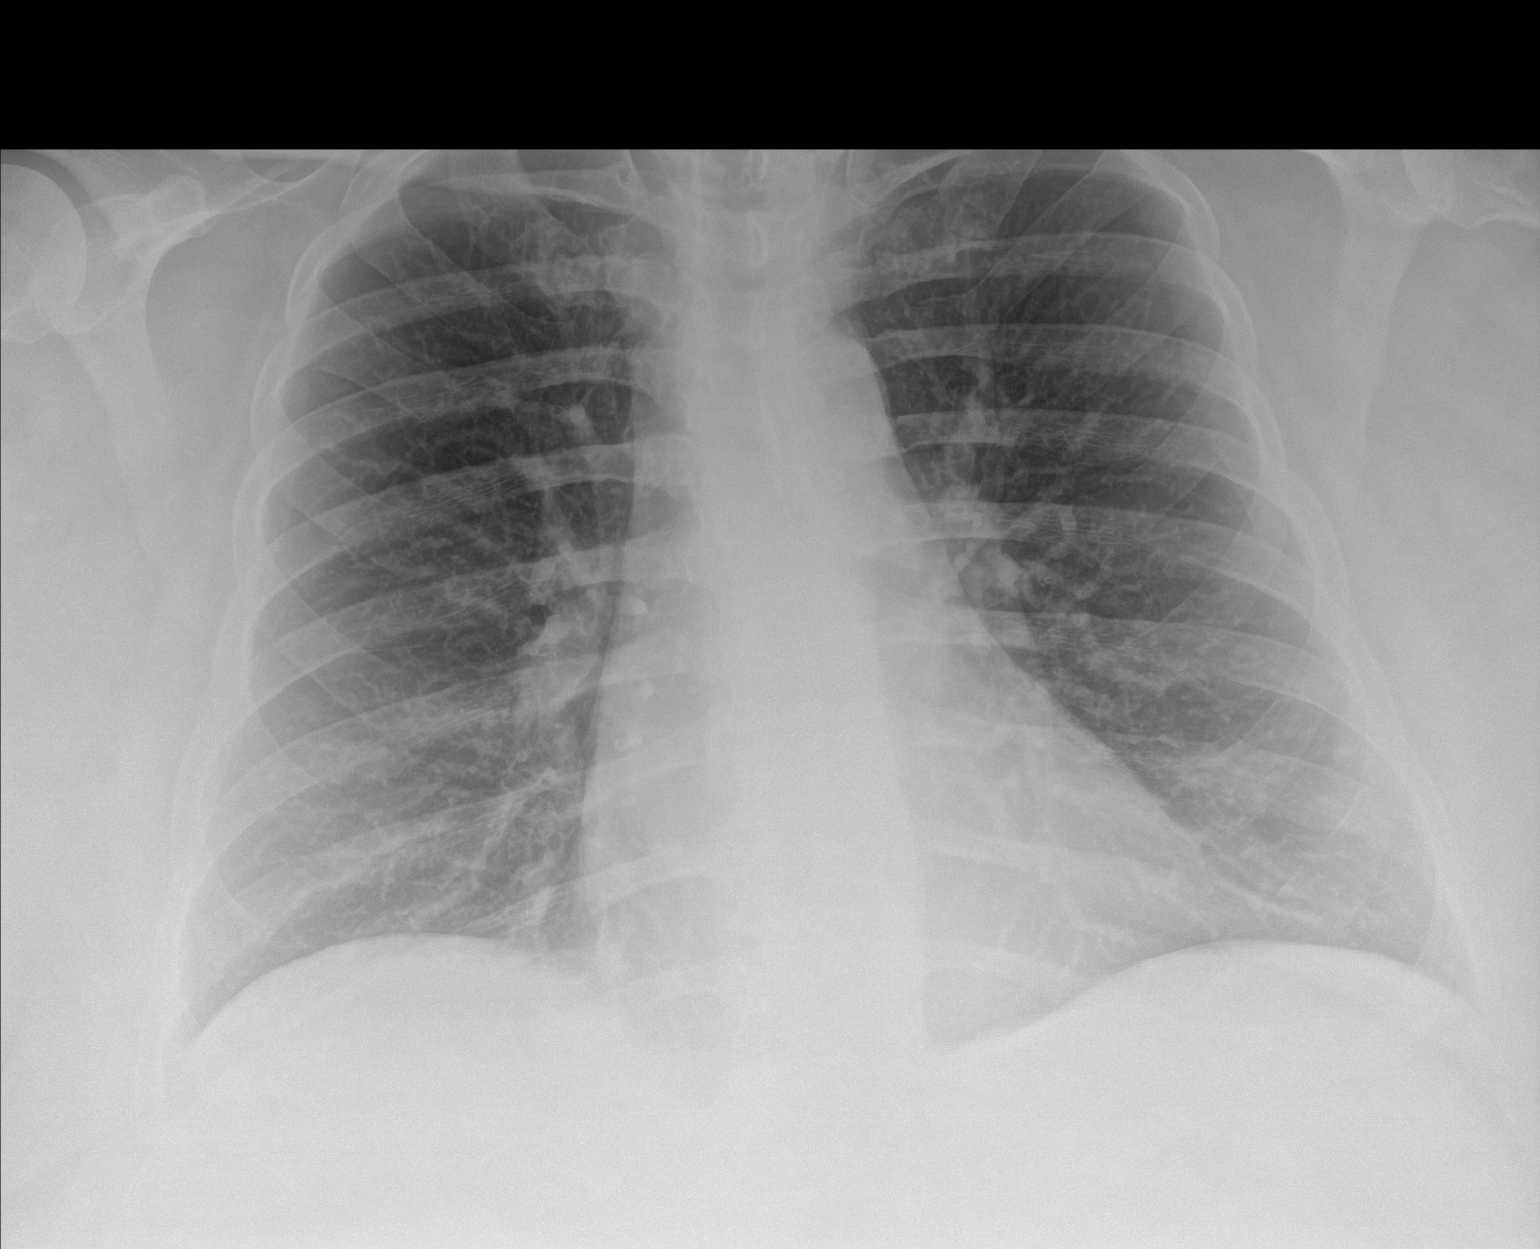

[1 of 1 positions shown; findings below may reference images not displayed]

FINDINGS: The lungs are clear. There is no pleural effusion pneumothorax. The
cardiac silhouette is within normal limits. No acute osseous
pathology.
IMPRESSION: No active cardiopulmonary disease.

## 2021-04-05 MED ORDER — TESTOSTERONE CYPIONATE 200 MG/ML INTRAMUSCULAR OIL
INTRAMUSCULAR | 5 refills | 56 days | Status: CP
Start: 2021-04-05 — End: ?

## 2021-05-02 ENCOUNTER — Ambulatory Visit: Admit: 2021-05-02 | Discharge: 2021-05-03

## 2021-05-02 DIAGNOSIS — Z113 Encounter for screening for infections with a predominantly sexual mode of transmission: Principal | ICD-10-CM

## 2021-05-02 DIAGNOSIS — E119 Type 2 diabetes mellitus without complications: Principal | ICD-10-CM

## 2021-05-02 DIAGNOSIS — B950 Streptococcus, group A, as the cause of diseases classified elsewhere: Principal | ICD-10-CM

## 2021-05-02 DIAGNOSIS — E1165 Type 2 diabetes mellitus with hyperglycemia: Principal | ICD-10-CM

## 2021-05-02 MED ORDER — INSULIN NPH ISOPHANE U-100 HUMAN 100 UNIT/ML SUBCUTANEOUS SUSPENSION
Freq: Two times a day (BID) | SUBCUTANEOUS | 2 refills | 100 days | Status: CP
Start: 2021-05-02 — End: 2021-05-02

## 2021-05-02 MED ORDER — PEN NEEDLE, DIABETIC 32 GAUGE X 5/32" (4 MM)
Freq: Two times a day (BID) | SUBCUTANEOUS | 3 refills | 90 days | Status: CP
Start: 2021-05-02 — End: 2022-05-02

## 2021-05-02 MED ORDER — INSULIN NPH ISOPHANE U-100 HUMAN 100 UNIT/ML (3 ML) SUBCUTANEOUS PEN
Freq: Two times a day (BID) | SUBCUTANEOUS | 3 refills | 90 days | Status: CP
Start: 2021-05-02 — End: 2022-05-02

## 2021-05-02 MED ORDER — METFORMIN 1,000 MG TABLET
ORAL_TABLET | Freq: Two times a day (BID) | ORAL | 3 refills | 90 days | Status: CP
Start: 2021-05-02 — End: 2022-05-02
  Filled 2021-07-27: qty 180, 90d supply, fill #0

## 2021-05-03 MED ORDER — AMOXICILLIN 500 MG CAPSULE
ORAL_CAPSULE | Freq: Two times a day (BID) | ORAL | 0 refills | 10 days | Status: CP
Start: 2021-05-03 — End: 2021-05-13

## 2021-05-30 ENCOUNTER — Institutional Professional Consult (permissible substitution): Admit: 2021-05-30 | Discharge: 2021-05-31 | Attending: Pharmacist | Primary: Pharmacist

## 2021-07-27 MED FILL — BD LUER-LOK SYRINGE 3 ML 18 X 1 1/2": 28 days supply | Qty: 4 | Fill #1

## 2021-07-27 MED FILL — BD REGULAR BEVEL NEEDLES 22 GAUGE X 1 1/2": 28 days supply | Qty: 4 | Fill #1

## 2021-07-27 MED FILL — TESTOSTERONE CYPIONATE 200 MG/ML INTRAMUSCULAR OIL: INTRAMUSCULAR | 28 days supply | Qty: 4 | Fill #0

## 2021-08-01 ENCOUNTER — Ambulatory Visit: Admit: 2021-08-01

## 2021-09-19 ENCOUNTER — Ambulatory Visit: Admit: 2021-09-19 | Discharge: 2021-09-20

## 2021-09-19 DIAGNOSIS — E1159 Type 2 diabetes mellitus with other circulatory complications: Principal | ICD-10-CM

## 2021-09-19 DIAGNOSIS — L0211 Cutaneous abscess of neck: Principal | ICD-10-CM

## 2021-09-19 DIAGNOSIS — I152 Hypertension secondary to endocrine disorders: Principal | ICD-10-CM

## 2021-09-19 DIAGNOSIS — E119 Type 2 diabetes mellitus without complications: Principal | ICD-10-CM

## 2021-09-19 MED ORDER — VALSARTAN 40 MG TABLET
ORAL_TABLET | Freq: Every day | ORAL | 3 refills | 90 days | Status: CP
Start: 2021-09-19 — End: 2022-09-19
  Filled 2021-09-19: qty 30, 30d supply, fill #0

## 2021-09-19 MED ORDER — GLIPIZIDE ER 5 MG TABLET, EXTENDED RELEASE 24 HR
ORAL_TABLET | Freq: Every day | ORAL | 3 refills | 90 days | Status: CP
Start: 2021-09-19 — End: ?
  Filled 2021-09-19: qty 30, 30d supply, fill #0

## 2021-09-19 MED FILL — TESTOSTERONE CYPIONATE 200 MG/ML INTRAMUSCULAR OIL: INTRAMUSCULAR | 28 days supply | Qty: 4 | Fill #1

## 2021-09-19 MED FILL — METFORMIN 1,000 MG TABLET: ORAL | 90 days supply | Qty: 180 | Fill #1

## 2021-09-19 MED FILL — BD LUER-LOK SYRINGE 3 ML 18 X 1 1/2": 28 days supply | Qty: 4 | Fill #2

## 2021-09-19 MED FILL — TRULICITY 4.5 MG/0.5 ML SUBCUTANEOUS PEN INJECTOR: SUBCUTANEOUS | 28 days supply | Qty: 2 | Fill #0

## 2021-10-05 ENCOUNTER — Ambulatory Visit: Admit: 2021-10-05

## 2021-10-11 MED FILL — TRULICITY 4.5 MG/0.5 ML SUBCUTANEOUS PEN INJECTOR: SUBCUTANEOUS | 28 days supply | Qty: 2 | Fill #0

## 2021-10-17 MED FILL — VALSARTAN 40 MG TABLET: ORAL | 90 days supply | Qty: 90 | Fill #0

## 2021-10-17 MED FILL — GLIPIZIDE ER 5 MG TABLET, EXTENDED RELEASE 24 HR: ORAL | 90 days supply | Qty: 90 | Fill #0

## 2021-11-22 ENCOUNTER — Ambulatory Visit: Admit: 2021-11-22 | Discharge: 2021-11-23

## 2021-11-22 DIAGNOSIS — E1159 Type 2 diabetes mellitus with other circulatory complications: Principal | ICD-10-CM

## 2021-11-22 DIAGNOSIS — I152 Hypertension secondary to endocrine disorders: Principal | ICD-10-CM

## 2021-11-22 DIAGNOSIS — E1165 Type 2 diabetes mellitus with hyperglycemia: Principal | ICD-10-CM

## 2021-12-28 MED FILL — TRULICITY 4.5 MG/0.5 ML SUBCUTANEOUS PEN INJECTOR: SUBCUTANEOUS | 28 days supply | Qty: 2 | Fill #1

## 2021-12-28 MED FILL — METFORMIN 1,000 MG TABLET: ORAL | 90 days supply | Qty: 180 | Fill #0

## 2021-12-28 MED FILL — VALSARTAN 40 MG TABLET: ORAL | 90 days supply | Qty: 90 | Fill #1

## 2022-02-20 DIAGNOSIS — E119 Type 2 diabetes mellitus without complications: Principal | ICD-10-CM

## 2022-02-21 MED ORDER — DULAGLUTIDE 4.5 MG/0.5 ML SUBCUTANEOUS PEN INJECTOR
SUBCUTANEOUS | 3 refills | 84 days | Status: CP
Start: 2022-02-21 — End: 2023-02-21
  Filled 2022-03-07: qty 2, 28d supply, fill #0

## 2022-02-23 ENCOUNTER — Ambulatory Visit: Admit: 2022-02-23

## 2022-03-16 ENCOUNTER — Ambulatory Visit: Admit: 2022-03-16 | Discharge: 2022-03-16

## 2022-03-16 DIAGNOSIS — E1165 Type 2 diabetes mellitus with hyperglycemia: Principal | ICD-10-CM

## 2022-04-13 MED FILL — METFORMIN 1,000 MG TABLET: ORAL | 90 days supply | Qty: 180 | Fill #1

## 2022-04-13 MED FILL — VALSARTAN 40 MG TABLET: ORAL | 90 days supply | Qty: 90 | Fill #2

## 2022-04-30 MED FILL — TRULICITY 4.5 MG/0.5 ML SUBCUTANEOUS PEN INJECTOR: SUBCUTANEOUS | 28 days supply | Qty: 2 | Fill #1

## 2022-05-25 ENCOUNTER — Ambulatory Visit
Admit: 2022-05-25 | Discharge: 2022-05-26 | Attending: Student in an Organized Health Care Education/Training Program | Primary: Student in an Organized Health Care Education/Training Program

## 2022-05-25 DIAGNOSIS — E1165 Type 2 diabetes mellitus with hyperglycemia: Principal | ICD-10-CM

## 2022-05-25 DIAGNOSIS — I10 Essential (primary) hypertension: Principal | ICD-10-CM

## 2022-05-25 MED ORDER — LANCETS 30 GAUGE
11 refills | 0 days | Status: CP
Start: 2022-05-25 — End: ?
  Filled 2022-05-25: qty 100, 100d supply, fill #0

## 2022-05-25 MED ORDER — BLOOD-GLUCOSE METER KIT WRAPPER
11 refills | 0 days | Status: CP
Start: 2022-05-25 — End: 2023-05-25

## 2022-05-25 MED ORDER — LANCING DEVICE
0 refills | 0 days
Start: 2022-05-25 — End: ?

## 2022-05-25 MED ORDER — BLOOD GLUCOSE TEST STRIPS
ORAL_STRIP | 11 refills | 0 days | Status: CP
Start: 2022-05-25 — End: 2023-05-25
  Filled 2022-05-25: qty 1, 30d supply, fill #0

## 2022-05-25 MED FILL — ON CALL LANCING DEVICE: 30 days supply | Qty: 1 | Fill #0

## 2022-05-25 MED FILL — ON CALL EXPRESS TEST STRIP: 50 days supply | Qty: 50 | Fill #0

## 2022-05-29 DIAGNOSIS — J4520 Mild intermittent asthma, uncomplicated: Principal | ICD-10-CM

## 2022-05-29 DIAGNOSIS — E1165 Type 2 diabetes mellitus with hyperglycemia: Principal | ICD-10-CM

## 2022-05-29 MED ORDER — PROAIR HFA 90 MCG/ACTUATION AEROSOL INHALER
Freq: Four times a day (QID) | RESPIRATORY_TRACT | 6 refills | 53 days | Status: CP | PRN
Start: 2022-05-29 — End: 2023-05-29

## 2022-05-30 DIAGNOSIS — J4520 Mild intermittent asthma, uncomplicated: Principal | ICD-10-CM

## 2022-05-30 MED ORDER — GLIPIZIDE ER 10 MG TABLET, EXTENDED RELEASE 24 HR
ORAL_TABLET | Freq: Every day | ORAL | 3 refills | 90 days | Status: CP
Start: 2022-05-30 — End: 2023-05-30
  Filled 2022-06-01: qty 90, 90d supply, fill #0

## 2022-06-01 MED ORDER — PROAIR HFA 90 MCG/ACTUATION AEROSOL INHALER
Freq: Four times a day (QID) | RESPIRATORY_TRACT | 6 refills | 53 days | Status: CP | PRN
Start: 2022-06-01 — End: 2023-06-01

## 2022-06-13 DIAGNOSIS — J4520 Mild intermittent asthma, uncomplicated: Principal | ICD-10-CM

## 2022-06-13 MED ORDER — ALBUTEROL SULFATE HFA 90 MCG/ACTUATION AEROSOL INHALER
Freq: Four times a day (QID) | RESPIRATORY_TRACT | 6 refills | 53 days | Status: CP | PRN
Start: 2022-06-13 — End: 2023-06-13
  Filled 2022-06-14: qty 18, 25d supply, fill #0

## 2022-06-19 ENCOUNTER — Ambulatory Visit: Admit: 2022-06-19 | Discharge: 2022-06-19

## 2022-06-19 MED ORDER — OZEMPIC 0.25 MG OR 0.5 MG (2 MG/3 ML) SUBCUTANEOUS PEN INJECTOR
SUBCUTANEOUS | 0 refills | 56 days | Status: CP
Start: 2022-06-19 — End: 2022-08-14

## 2022-06-20 DIAGNOSIS — E119 Type 2 diabetes mellitus without complications: Principal | ICD-10-CM

## 2022-06-29 DIAGNOSIS — E1165 Type 2 diabetes mellitus with hyperglycemia: Principal | ICD-10-CM

## 2022-06-29 MED ORDER — DULAGLUTIDE 4.5 MG/0.5 ML SUBCUTANEOUS PEN INJECTOR
SUBCUTANEOUS | 3 refills | 84 days | Status: CP
Start: 2022-06-29 — End: 2022-09-15
  Filled 2022-07-25: qty 2, 28d supply, fill #0

## 2022-07-03 NOTE — Unmapped (Signed)
[facility] has reached out to this patient via MyChart to onboard them to our Specialty Lite services for their Trulicity. They will now receive proactive outreach from the pharmacy team for refills.    Falana Clagg H Linnaea Ahn, PharmD  [facility]ist

## 2022-07-03 NOTE — Unmapped (Signed)
[facility] Specialty Medication Onboarding    Specialty Medication: TRULICITY 4.5 mg/0.5 mL Pnij (dulaglutide)  Prior Authorization: Approved   Financial Assistance: No - copay  <$25  Final Copay/Day Supply: $0 / 28    Insurance Restrictions: None     Notes to Pharmacist: refill  Credit Card on File: no    The triage team has completed the benefits investigation and has determined that the patient is able to fill this medication at [facility]. Please contact the patient to complete the onboarding or follow up with the prescribing physician as needed.

## 2022-07-04 MED ORDER — LANCETS
Freq: Every day | 3 refills | 0 days | Status: CP
Start: 2022-07-04 — End: ?

## 2022-07-05 MED FILL — ON CALL EXPRESS TEST STRIP: 50 days supply | Qty: 50 | Fill #0

## 2022-07-05 NOTE — Unmapped (Signed)
Shawn Walls requested a refill of their Trulicity via IVR/Web. The [facility] has scheduled delivery per the patients request via UPS to be delivered to their prescription address on 07/06/22.

## 2022-07-16 NOTE — Unmapped (Signed)
PAP TERMINATED ACTIVE COVERAGE FOUND

## 2022-07-20 DIAGNOSIS — E1165 Type 2 diabetes mellitus with hyperglycemia: Principal | ICD-10-CM

## 2022-07-25 MED FILL — GLIPIZIDE ER 10 MG TABLET, EXTENDED RELEASE 24 HR: ORAL | 90 days supply | Qty: 90 | Fill #1

## 2022-08-21 ENCOUNTER — Ambulatory Visit: Admit: 2022-08-21 | Payer: PRIVATE HEALTH INSURANCE

## 2022-12-22 ENCOUNTER — Ambulatory Visit
Admit: 2022-12-22 | Discharge: 2022-12-23 | Disposition: A | Discharge status: Home / Self Care | Payer: PRIVATE HEALTH INSURANCE | Attending: Emergency Medicine

## 2022-12-22 DIAGNOSIS — L723 Sebaceous cyst: Principal | ICD-10-CM

## 2022-12-23 NOTE — Unmapped (Signed)
Pt reports cyst to the back of head for 2 week. States it is painful and getting bigger. Denies any fever/chills.

## 2022-12-23 NOTE — Unmapped (Signed)
[facility]  Emergency Department Provider Note    ED Clinical Impression     Final diagnoses:   Sebaceous cyst ([place])       HPI, ED Course, Assessment and Plan     Initial Clinical Impression:    December 22, 2022 7:11 PM   Shawn Walls is a 47 y.o. male with past medical history of HTN, HLD, T2DM, sleep apnea, paroxysmal supraventricular tachycardia, and asthma presenting with cyst. The patient reports 2 weeks of cyst to the back of his head on the right side. He suspects that this is caused by his CPAP as the headgear strap presses against the affected site. He started to use a clean cloth between his skin and the headgear 2/2 breakouts on his neck consistent with baseline. On evaluation, the patient reports that the cyst has doubled in size since its onset. He states that the area is tender with deep palpation, such that the underlying bone begins to feel sore. He has not taken Tylenol today for his symptoms. Denies fevers, chills, nausea, or emesis.    BP 122/74  - Pulse 87  - Temp 36.7 ??C (98 ??F) (Oral)  - Resp 18  - Wt (!) 191 kg (421 lb)  - SpO2 97%  - BMI 62.17 kg/m??   Initial vital signs are notable for borderline tachycardia to 100, but are otherwise within normal limits.  Pertinent physical exam: On my initial evaluation, the patient is nontoxic appearing, in no acute distress. Exam is notable for cyst to the posterior right occiput as pictured below.  There are no overlying signs of cellulitis or fluctuance to suggest abscess.  The area is firm to palpation, there is a central umbilication.    Medical Decision Making  This is a 47 y.o. male with history as above presenting with apparent sebaceous cyst to the back of his head.  There are no signs of overlying infection and given the chance of recurrence and refilling of cyst sac, I discussed incision and drainage here in the ED may expose him to higher risk of superinfection.  Reassuringly there are no signs of infection at this point.  I discussed home care, and offered the patient medications here for pain control but he declines as he has the same medicines at home and prefers to take them there.  Discussed signs and symptoms for which she should return to the ED, he endorses understanding and agreement with these and will be provided with a referral to plastics for definitive management.  All questions answered.    Further ED updates and updates to plan as per ED Course below:    ED Course:       MDM Elements  I have reviewed recent and relevant previous record, including: Outpatient notes - 06/19/2022 [facility] Internal Medicine note for [facility]    Prescription drug(s) considered but not prescribed: Antibiotics - strongly considered however no evidence of bacterial infection at this time, pain control-multimodal approach is appropriate    Social Determinants that significantly affected care: None  Discussion of Management with other Physicians, QHP or Appropriate Source: None  Escalation of Care including OBS/Admission/Transfer was considered: However, patient was determined to be appropriate for outpatient management.  ____________________________________________    The case was discussed with the attending physician who is in agreement with the above assessment and plan.     Past History     PAST MEDICAL HISTORY/PAST SURGICAL HISTORY:   Past Medical History:   Diagnosis Date  Asthma     as child, prn albuterol    Diabetes mellitus (CMS-HCC)     HLD (hyperlipidemia)     Hypertension 05/25/2022    Morbid obesity with BMI of 60.0-69.9, adult (CMS-HCC)     Pneumonia 12/2018    PONV (postoperative nausea and vomiting)     Sleep apnea        Past Surgical History:   Procedure Laterality Date    PR NASAL/SINUS ENDOSCOPY,RMV TISS MAXILL SINUS Bilateral 06/19/2019    Procedure: NASAL/SINUS ENDOSCOPY, SURGICAL WITH MAXILLARY ANTROSTOMY; WITH REMOVAL OF TISSUE FROM MAXILLARY SINUS;  Surgeon: Fanny Bien, MD;  Location: MAIN OR [facility];  Service: ENT    PR NASAL/SINUS NDSC TOT W/SPHENDT W/SPHEN TISS RMVL Bilateral 06/19/2019    Procedure: NASAL/SINUS ENDOSCOPY, SURGICAL WITH ETHMOIDECTOMY; TOTAL (ANTERIOR AND POSTERIOR), INCLUDING SPHENOIDOTOMY, WITH REMOVAL OF TISSUE FROM THE SPHENOID SINUS;  Surgeon: Fanny Bien, MD;  Location: MAIN OR [facility];  Service: ENT    PR NASAL/SINUS NDSC W/RMVL TISS FROM FRONTAL SINUS Bilateral 06/19/2019    Procedure: NASAL/SINUS ENDOSCOPY, SURGICAL, WITH FRONTAL SINUS EXPLORATION, INCLUDING REMOVAL OF TISSUE FROM FRONTAL SINUS, WHEN PERFORMED;  Surgeon: Fanny Bien, MD;  Location: MAIN OR [facility];  Service: ENT    PR REPAIR OF NASAL SEPTUM Bilateral 06/19/2019    Procedure: SEPTOPLASTY OR SUBMUCOUS RESECTION, WITH OR WITHOUT CARTILAGE SCORING, CONTOURING OR REPLACEMENT WITH GRAFT;  Surgeon: Fanny Bien, MD;  Location: MAIN OR [facility];  Service: ENT    PR STEREOTACTIC COMP ASSIST PROC,CRANIAL,EXTRADURAL Bilateral 06/19/2019    Procedure: STEREOTACTIC COMPUTER-ASSISTED (NAVIGATIONAL) PROCEDURE; CRANIAL, EXTRADURAL;  Surgeon: Fanny Bien, MD;  Location: MAIN OR [facility];  Service: ENT    WISDOM TOOTH EXTRACTION         MEDICATIONS:   No current facility-administered medications for this encounter.    Current Outpatient Medications:     albuterol HFA 90 mcg/actuation inhaler, Inhale 2 puffs every six (6) hours as needed for wheezing or shortness of breath. (Patient not taking: Reported on 06/19/2022), Disp: 18 g, Rfl: 6    ascorbic acid, vitamin C, (VITAMIN C) 1000 MG tablet, Take 1 tablet (1,000 mg total) by mouth daily. (Patient not taking: Reported on 06/19/2022), Disp: , Rfl:     blood sugar diagnostic (GLUCOSE BLOOD) Strp, Use 1 strip to check blood sugar once daily, Disp: 50 strip, Rfl: 11    blood-glucose meter kit, Use to check blood sugar as directed., Disp: 1 each, Rfl: 11    blood-glucose meter Misc, Use as instructed, Disp: 1 each, Rfl: 0    cholecalciferol, vitamin D3, (VITAMIN D3 ORAL), Take by mouth. (Patient not taking: Reported on 06/19/2022), Disp: , Rfl:     glipiZIDE (GLUCOTROL XL) 10 MG 24 hr tablet, Take 1 tablet (10 mg total) by mouth daily., Disp: 90 tablet, Rfl: 3    lancets 30 gauge Misc, Use 1 lancet to check blood sugar once daily, Disp: 100 each, Rfl: 11    lancets 30 gauge Misc, Use 1 daily in the morning before breakfast., Disp: 100 each, Rfl: 3    lancing device Misc, use as directed, Disp: 1 each, Rfl: 0    metFORMIN (GLUCOPHAGE) 1000 MG tablet, Take 1 tablet (1,000 mg total) by mouth in the morning and 1 tablet (1,000 mg total) in the evening. Take with meals., Disp: 180 tablet, Rfl: 3    SELENIUM ORAL, Take 1 tablet by mouth daily.  (Patient not taking: Reported on 06/19/2022), Disp: , Rfl:     syringe with needle  3 mL 18 x 1 1/2 Syrg, Use 1 each to inject testosterone once weekly as directed., Disp: 100 each, Rfl: 3    testosterone cypionate (DEPOTESTOTERONE CYPIONATE) 200 mg/mL injection, Inject 0.5 mL (100 mg total) into the muscle once a week. Discard remaining 0.60mL., Disp: 4 mL, Rfl: 5    UNABLE TO FIND, Vitamin B3 Non-Flush, Disp: , Rfl:     valsartan (DIOVAN) 40 MG tablet, Take 1 tablet (40 mg total) by mouth daily., Disp: 90 tablet, Rfl: 3    zinc gluconate 50 mg tablet, Take 1 tablet (50 mg total) by mouth daily. (Patient not taking: Reported on 06/19/2022), Disp: , Rfl:     ALLERGIES:   Penicillins and Penicillin    SOCIAL HISTORY:   Social History     Tobacco Use    Smoking status: Former     Current packs/day: 0.00     Average packs/day: 2.0 packs/day for 10.0 years (20.0 ttl pk-yrs)     Types: Cigarettes     Start date: 06/29/1996     Quit date: 06/30/2006     Years since quitting: 16.4     Passive exposure: Past    Smokeless tobacco: Current     Types: Chew   Substance Use Topics    Alcohol use: Never       FAMILY HISTORY:  Family History   Problem Relation Age of Onset    Anesthesia problems Neg Hx     Bleeding Disorder Neg Hx           Review of Systems   A review of systems was performed and relevant portions were as noted above in HPI     Physical Exam     VITAL SIGNS:    BP 122/74  - Pulse 87  - Temp 36.7 ??C (98 ??F) (Oral)  - Resp 18  - Wt (!) 191 kg (421 lb)  - SpO2 97%  - BMI 62.17 kg/m??     Constitutional:   Alert and oriented. In no acute distress. Please see pertinent exam above as well.  Head:   Normocephalic and atraumatic, see pictured skin findings below  Eyes:   Conjunctivae are normal, EOMI, PERRL  ENT:   No notable congestion, mucous membranes moist, external ears normal, no notable stridor  Cardiovascular:   Rate as vitals above. Appears warm and well perfused  Respiratory:   Normal respiratory effort. Breath sounds are normal.  Gastrointestinal:   Soft, non-distended, and nontender without rebound or guarding.   Genitourinary:   Deferred  Musculoskeletal:    Normal range of motion in all extremities. No tenderness or edema noted in B/L lower extremities  Neurologic:   No gross focal neurologic deficits beyond baseline are appreciated  Skin:   Skin is warm, dry and intact, there is a firm approximately 6 x 5 cm mass with no appreciable fluctuance or crepitus or warmth or erythema.  There is a central umbilication.  There is tenderness to palpation deeply but no exquisite tenderness to palpation      Radiology     No orders to display       Labs     Labs Reviewed - No data to display    Pertinent labs & imaging results that were available during my care of the patient were reviewed by me and considered in my medical decision making. Labs and radiology studies included in my note may not constitute all ordered/reviewied labs during this encounter (see chart for details).  Please note- This chart has been created using [employer]. Chart creation errors have been sought, but may not always be located and such creation errors, especially pronoun confusion, do NOT reflect on the standard of medical care.    Documentation assistance was provided by Wesley Desanctis, Scribe on December 22, 2022 at 7:11 PM for Marlyn Corporal, MD.    December 23, 2022 8:52 AM. Documentation assistance provided by the scribe. I was present during the time the encounter was recorded. The information recorded by the scribe was done at my direction and has been reviewed and validated by me.          Ripley Fraise, MD  Resident  12/23/22 [phone]

## 2022-12-26 ENCOUNTER — Ambulatory Visit: Admit: 2022-12-26 | Discharge: 2022-12-27 | Payer: PRIVATE HEALTH INSURANCE

## 2022-12-26 DIAGNOSIS — I10 Essential (primary) hypertension: Principal | ICD-10-CM

## 2022-12-26 DIAGNOSIS — E1165 Type 2 diabetes mellitus with hyperglycemia: Principal | ICD-10-CM

## 2022-12-26 DIAGNOSIS — R220 Localized swelling, mass and lump, head: Principal | ICD-10-CM

## 2022-12-26 DIAGNOSIS — E119 Type 2 diabetes mellitus without complications: Principal | ICD-10-CM

## 2022-12-26 MED ORDER — METFORMIN 1,000 MG TABLET
ORAL_TABLET | Freq: Two times a day (BID) | ORAL | 3 refills | 90.00 days | Status: CP
Start: 2022-12-26 — End: 2023-12-26
  Filled 2023-03-14: qty 180, 90d supply, fill #0

## 2022-12-26 MED ORDER — GLIPIZIDE ER 10 MG TABLET, EXTENDED RELEASE 24 HR
ORAL_TABLET | Freq: Every day | ORAL | 3 refills | 90.00 days | Status: CP
Start: 2022-12-26 — End: 2023-12-26
  Filled 2023-03-14: qty 90, 90d supply, fill #0

## 2022-12-26 NOTE — Unmapped (Signed)
Internal Medicine Clinic Visit    Reason for visit: Scalp Mass    A/P:    1. Mass of scalp    2. Type 2 diabetes mellitus with hyperglycemia, without long-term current use of insulin (CMS-HCC)    3. Type 2 diabetes mellitus without complication, without long-term current use of insulin (CMS-HCC)    4. Hypertension, unspecified type        Cyst on Head - Sebaceous Cyst vs. Furuncle  Thought to be from CPAP head gear. Was seen in ED on 12/14 for similar concern and deferred bedside I&D. Referred to plastics in ED, but unable to get an appointment. In clnic, has large firm tender mass over R posterior scalp that may be a cyst, but may also be a furuncle. Given location and size, it would be inappropriate to intervene on mass in clinic given that we are only able to do local anesthesia and incision would like be quite large. Suspect that it will need some form of sedation. During clinic visit, I paged plastic consult pager, but was told they are unable to do outpatient consults. I called general surgery clinic and communicated with nursing staff who tried to reach surgery providers. Received epic chat on 12/19 that since it is on the head that general surgery would not operate and that surgical oncology would be the one to operate. Called surgical oncology clinic to see if they would be able to remove mass and awaiting further communication.   -Patient currently using 4g of Tylenol. Discussed trying ibuprofen as well for anti-inflammatory effect. Patient was not amenable to this plan and requested percocet only. I discussed that we should try other types of pain medication first, but patient was not amenable to that at this time and I agreed to call him as soon as I had confirmation from a surgeon who could intervene.     2. T2DM  POCT A1C 12.6%. Has been off of antihyperglycemic medications (Glipizide and Metformin) for a few months after getting off of PAP and back on Medicaid.   -Refilled Glipizide and Metformin    No follow-ups on file.    Staffed with Dr. Irena Cords, seen and discussed    __________________________________________________________    HPI:  Had rapidly enlarging mass on posterior scalp on right side. Thought to be from CPAP mask. Has a ring of erythematous macules with overlying hair clearing on lower scalp. Mass is tender to touch and has pain that wraps around head from stretching when moving head a certain way. Feels pressure on scalp. Feels like an ache/pressure, but occasionally a bee sting. No bleeding or drainage from mass. Has been using tylenol, but states that percocet is the only pain medication that will work for him.     Was seen in ED where they did not intervene stating that it should be plastic surgery. Patient unable to get appointment with plastic surgery.   __________________________________________________________    Medications:  Reviewed in EPIC  __________________________________________________________    Physical Exam:   Vital Signs:  Vitals:    12/26/22 1421   BP: 125/87   BP Site: L Arm   BP Position: Sitting   BP Cuff Size: Large   Pulse: 87   Temp: 36.1 ??C (96.9 ??F)   TempSrc: Temporal   SpO2: 98%   Weight: (!) 196.1 kg (432 lb 6.4 oz)   Height: 175.3 cm (5' 9)     Gen: NAD  Head: Large 6.5 x 5cm mass over posterior R  scalp. Firm with area of central clearing around hair follicle.   Eye: Anicteric.   Pulm: normal WOB. No accessory muscle use noted.   Skin: Ring of small 5mm erythematous macules with loss of hair over them noted over lower scalp/neck.         Medication adherence and barriers to the treatment plan have been addressed. Opportunities to optimize healthy behaviors have been discussed. Patient / caregiver voiced understanding.

## 2022-12-26 NOTE — Unmapped (Signed)
[facility] BPs  BP#1 131/92 p 86   BP#2 122/85 p 88  BP#3 121/84 p 87    Average BP 125/87 p 87  (please note this as a comment in vitals)

## 2022-12-27 NOTE — Unmapped (Addendum)
I am sorry that you are not feeling well today.     For your pain, you can also try alternating your tylenol with ibuprofen as well which can help with inflammation and pain. You can take 600-800mg  of Ibuprofen up to three times per day. You can take up to 3 grams of tylenol daily.

## 2022-12-27 NOTE — Unmapped (Signed)
While in clinic today paged plastic surgery who does not do outpatient consults, also called general surgery clinic and coordinate with surgery nursing staff to figure out how to get patient seen urgently. After this, I spoke to patient about trying to get him into general surgery clinic. I discussed trying ibuprofen with tylenol rather than jumping straight to opioid pain medication at this time. Patient declined this suggestion and then left. I will be calling patient once I have heard from surgery about how to get him into their clinic sooner.

## 2022-12-27 NOTE — Unmapped (Signed)
[facility]_Oper New Intake Call     Hello,    Shawn Walls  06-03-1975  [phone]    We just received a call regarding this patient. They are requesting a new appointment for a Ears, Nose, and Throat (Surgical Oncology) for a non cancerous diagnosis. Patient has cyst on back head(picture located in [place].) Dr.Muthukkumar asked if surgical oncology would do a mass removal? Please contact Dr.Muthukkumar via secure chat or by phone, [phone]    -Provider is included in this message.     Referral?: No, not yet.     Thank you,   Durward Fortes  [facility]  [phone]

## 2022-12-28 ENCOUNTER — Ambulatory Visit: Admit: 2022-12-28 | Payer: PRIVATE HEALTH INSURANCE

## 2022-12-28 NOTE — Unmapped (Signed)
Called pt , he complaints of painful and swollen cyst at the back of his head. Reports pain and swelling have worsened since clinic visit on 12/26/22.  Pain11/10 back of head radiating to neck and upper back. Appointment scheduled this afternoon in [facility].

## 2022-12-29 ENCOUNTER — Encounter: Payer: Self-pay | Admitting: Emergency Medicine

## 2022-12-29 ENCOUNTER — Ambulatory Visit
Admission: EM | Admit: 2022-12-29 | Discharge: 2022-12-29 | Disposition: A | Discharge status: Home / Self Care | Payer: Medicaid Other | Attending: Family Medicine | Admitting: Family Medicine

## 2022-12-29 DIAGNOSIS — L02811 Cutaneous abscess of head [any part, except face]: Secondary | ICD-10-CM

## 2022-12-29 MED ORDER — HYDROCODONE-ACETAMINOPHEN 5-325 MG PO TABS: 1.0000 | ORAL_TABLET | Freq: Four times a day (QID) | ORAL | 0 refills | Status: AC | PRN

## 2022-12-29 MED ORDER — DOXYCYCLINE HYCLATE 100 MG PO TABS
100.0000 mg | ORAL_TABLET | Freq: Two times a day (BID) | ORAL | 0 refills | Status: AC
Start: 2022-12-29 — End: ?

## 2022-12-29 NOTE — Discharge Instructions (Signed)
Warm compresses.  Medication as prescribed.  Follow up on Monday. If you worsen, go directly to the ER @ [facility] or [facility].

## 2022-12-29 NOTE — ED Provider Notes (Signed)
[facility]    CSN: [id number] Arrival date & time: 12/29/22  1003      History   Chief Complaint Chief Complaint  Patient presents with   Neck Pain    HPI Jimmy Atkins is a 47 y.o. male who presents with scalp abscess.  Patient reports that he has been dealing with this for a few weeks.  Was seen in the ER for this on 12/14 and was also seen by his primary care physician on 12/18.  No intervention has been done.  The area of concern has gotten larger and is incredibly painful.  Not currently draining.  He reports that he is having some associated pain to the neck.  The area is located posteriorly on the right side. No fever.   Past Medical History:  Diagnosis Date   Diabetes mellitus without complication (HCC)     There are no active problems to display for this patient.   Past Surgical History:  Procedure Laterality Date   NASAL SINUS SURGERY         Home Medications    Prior to Admission medications   Medication Sig Start Date End Date Taking? Authorizing Provider  doxycycline (VIBRA-TABS) 100 MG tablet Take 1 tablet (100 mg total) by mouth 2 (two) times daily. 12/29/22  Yes Trystan Akhtar G, DO  glipiZIDE (GLUCOTROL XL) 10 MG 24 hr tablet Take 1 tablet by mouth daily. 12/26/22 12/26/23 Yes [provider]  HYDROcodone-acetaminophen (NORCO/VICODIN) 5-325 MG tablet Take 1 tablet by mouth every 6 (six) hours as needed. 12/29/22  Yes Tommie Sams, DO  metFORMIN (GLUCOPHAGE) 1000 MG tablet Take by mouth. 05/02/21 12/26/23 Yes [provider]    Family History History reviewed. No pertinent family history.  Social History Social History   Tobacco Use   Smoking status: Former    Current packs/day: 0.00    Types: Cigarettes    Quit date: 2008    Years since quitting: 16.9   Smokeless tobacco: Current    Types: Chew  Vaping Use   Vaping status: Never Used  Substance Use Topics   Alcohol use: Not Currently   Drug use: Never      Allergies   Penicillins   Review of Systems Review of Systems  Constitutional: Negative.   Musculoskeletal:  Positive for neck pain.     Physical Exam Triage Vital Signs ED Triage Vitals  Encounter Vitals Group     BP 12/29/22 1024 (!) 138/91     Systolic BP Percentile --      Diastolic BP Percentile --      Pulse Rate 12/29/22 1024 98     Resp 12/29/22 1024 16     Temp 12/29/22 1024 98.1 F (36.7 C)     Temp Source 12/29/22 1024 Oral     SpO2 12/29/22 1024 97 %     Weight 12/29/22 1021 (!) 419 lb 15.6 oz (190.5 kg)     Height 12/29/22 1021 5\' 9"  (1.753 m)     Head Circumference --      Peak Flow --      Pain Score 12/29/22 1021 10     Pain Loc --      Pain Education --      Exclude from Growth Chart --    No data found.  Updated Vital Signs BP (!) 138/91 (BP Location: Right Arm)   Pulse 98   Temp 98.1 F (36.7 C) (Oral)   Resp 16  Ht 5\' 9"  (1.753 m)   Wt (!) 190.5 kg   SpO2 97%   BMI 62.02 kg/m   Visual Acuity Right Eye Distance:   Left Eye Distance:   Bilateral Distance:    Right Eye Near:   Left Eye Near:    Bilateral Near:     Physical Exam Vitals and nursing note reviewed.  Constitutional:      General: He is not in acute distress.    Appearance: He is obese.  HENT:     Head: Normocephalic and atraumatic.      Comments: Large abscess with mild fluctuance at the labeled location.  Extending inferiorly.  Exquisitely tender to palpation.  Surrounding erythema and has significant induration as well. Pulmonary:     Effort: Pulmonary effort is normal. No respiratory distress.  Neurological:     Mental Status: He is alert.      UC Treatments / Results  Labs (all labs ordered are listed, but only abnormal results are displayed) Labs Reviewed - No data to display  EKG   Radiology No results found.  Procedures Incision and Drainage  Date/Time: 12/29/2022 12:00 PM  Performed by: Tommie Sams, DO Authorized by: Tommie Sams, DO   Consent:    Consent obtained:  Verbal   Consent given by:  Patient Location:    Type:  Abscess   Location:  Head   Head location:  Scalp Pre-procedure details:    Skin preparation:  Povidone-iodine Anesthesia:    Anesthesia method:  Local infiltration   Local anesthetic:  Lidocaine 1% WITH epi Procedure type:    Complexity:  Simple Procedure details:    Incision types:  Stab incision   Drainage:  Purulent   Drainage amount:  Moderate   Wound treatment:  Wound left open   Packing materials:  None Post-procedure details:    Procedure completion:  Tolerated well, no immediate complications  (including critical care time)  Medications Ordered in UC Medications - No data to display  Initial Impression / Assessment and Plan / UC Course  I have reviewed the triage vital signs and the nursing notes.  Pertinent labs & imaging results that were available during my care of the patient were reviewed by me and considered in my medical decision making (see chart for details).    47 year old male presents with a large scalp abscess.  Incision and drainage performed today.  Advised warm compresses.  Doxycycline as prescribed.  Norco as needed for pain.  Advised him to go directly to the ER if he worsens or fails to improve.  Recommended [facility] or [facility] as they have subspecialties available for consultation.  Final Clinical Impressions(s) / UC Diagnoses   Final diagnoses:  Scalp abscess     Discharge Instructions      Warm compresses.  Medication as prescribed.  Follow up on Monday. If you worsen, go directly to the ER @ [facility] or [facility].    ED Prescriptions     Medication Sig Dispense Auth. Provider   doxycycline (VIBRA-TABS) 100 MG tablet Take 1 tablet (100 mg total) by mouth 2 (two) times daily. 28 tablet Zeke Aker G, DO   HYDROcodone-acetaminophen (NORCO/VICODIN) 5-325 MG tablet Take 1 tablet by mouth every 6 (six) hours as needed. 20 tablet Everlene Other G, DO      I  have reviewed the PDMP during this encounter.   Tommie Sams, [place] 12/29/22 1201

## 2022-12-29 NOTE — ED Triage Notes (Signed)
Patient states that he did have an abscess on the back of his head a week ago.  Patient states that he is now having severe pain and swelling on the right side of his neck.  Patient reports chills last night.

## 2023-02-19 NOTE — Unmapped (Signed)
I saw and evaluated the patient, participating in the key portions of the service.  I reviewed the resident’s note.  I agree with the resident’s findings and plan. Ayham Word A Ahni Bradwell, MD

## 2023-03-07 NOTE — Unmapped (Signed)
 Mychart message sent informing pt refills for metformin and glipizide available at [facility].

## 2023-03-07 NOTE — Unmapped (Signed)
 Just an FYI, spoke to pt about appointment today and stated if he would like to speak to nurse about blood in urine, pt stated he rather be seen instead about it. At first his mom scheduled his appointment in resident clinic but pt called to be seen in [facility] instead.

## 2023-03-07 NOTE — Unmapped (Signed)
 The PAC has received an incoming call requesting a    Prior Authorization    Caller: Josh Demario Faniel  Best callback number: [phone]    Type of request (refill, clarification, question):   Name of medication:     glipiZIDE (GLUCOTROL XL) 10 MG 24 hr tablet   metFORMIN (GLUCOPHAGE) 1000 MG tablet     Is there a preferred amount requested (e.g. 30 pills, 3 months worth - if no leave blank):   Desired pharmacy:   [facility]  [address]  [place] [address]  Phone: [phone] Fax: [phone]    Does caller request a callback from a nurse to discuss?: Yes, best call back number listed above.

## 2023-03-22 ENCOUNTER — Emergency Department
Admit: 2023-03-22 | Discharge: 2023-03-23 | Disposition: A | Discharge status: Home / Self Care | Payer: PRIVATE HEALTH INSURANCE

## 2023-03-22 LAB — URINALYSIS WITH MICROSCOPY WITH CULTURE REFLEX PERFORMABLE
BACTERIA: NONE SEEN /HPF
BILIRUBIN UA: NEGATIVE
GLUCOSE UA: >1000 — AB
KETONES UA: 40 — AB
NITRITE UA: NEGATIVE
PH UA: 5.5 (ref 5.0–9.0)
PROTEIN UA: NEGATIVE
RBC UA: 2 /HPF (ref <=3)
SPECIFIC GRAVITY UA: 1.014 (ref 1.003–1.030)
SQUAMOUS EPITHELIAL: 1 /HPF (ref 0–5)
UROBILINOGEN UA: <2
WBC UA: 60 /HPF — ABNORMAL HIGH (ref <=2)

## 2023-03-22 LAB — COMPREHENSIVE METABOLIC PANEL
ALBUMIN: 3.3 g/dL — ABNORMAL LOW (ref 3.4–5.0)
ALKALINE PHOSPHATASE: 109 U/L (ref 46–116)
ALT (SGPT): 24 U/L (ref 10–49)
ANION GAP: 14 mmol/L (ref 5–14)
AST (SGOT): 17 U/L (ref <=34)
BILIRUBIN TOTAL: 0.5 mg/dL (ref 0.3–1.2)
BLOOD UREA NITROGEN: 17 mg/dL (ref 9–23)
BUN / CREAT RATIO: 25
CALCIUM: 9.9 mg/dL (ref 8.7–10.4)
CHLORIDE: 94 mmol/L — ABNORMAL LOW (ref 98–107)
CO2: 27.3 mmol/L (ref 20.0–31.0)
CREATININE: 0.69 mg/dL — ABNORMAL LOW (ref 0.73–1.18)
EGFR CKD-EPI (2021) MALE: >90 mL/min/{1.73_m2} (ref >=60)
GLUCOSE RANDOM: 266 mg/dL — ABNORMAL HIGH (ref 70–179)
POTASSIUM: 3.7 mmol/L (ref 3.4–4.8)
PROTEIN TOTAL: 8 g/dL (ref 5.7–8.2)
SODIUM: 135 mmol/L (ref 135–145)

## 2023-03-22 LAB — CBC W/ AUTO DIFF
BASOPHILS ABSOLUTE COUNT: 0.2 10*9/L — ABNORMAL HIGH (ref 0.0–0.1)
BASOPHILS RELATIVE PERCENT: 1.3 %
EOSINOPHILS ABSOLUTE COUNT: 0.4 10*9/L (ref 0.0–0.5)
EOSINOPHILS RELATIVE PERCENT: 3.1 %
HEMATOCRIT: 41.7 % (ref 39.0–48.0)
HEMOGLOBIN: 14 g/dL (ref 12.9–16.5)
LYMPHOCYTES ABSOLUTE COUNT: 3.5 10*9/L (ref 1.1–3.6)
LYMPHOCYTES RELATIVE PERCENT: 29.4 %
MEAN CORPUSCULAR HEMOGLOBIN CONC: 33.7 g/dL (ref 32.0–36.0)
MEAN CORPUSCULAR HEMOGLOBIN: 27.8 pg (ref 25.9–32.4)
MEAN CORPUSCULAR VOLUME: 82.5 fL (ref 77.6–95.7)
MEAN PLATELET VOLUME: 8.4 fL (ref 6.8–10.7)
MONOCYTES ABSOLUTE COUNT: 1.1 10*9/L — ABNORMAL HIGH (ref 0.3–0.8)
MONOCYTES RELATIVE PERCENT: 9.7 %
NEUTROPHILS ABSOLUTE COUNT: 6.6 10*9/L (ref 1.8–7.8)
NEUTROPHILS RELATIVE PERCENT: 56.5 %
NUCLEATED RED BLOOD CELLS: 0 /100{WBCs} (ref <=4)
PLATELET COUNT: 388 10*9/L (ref 150–450)
RED BLOOD CELL COUNT: 5.06 10*12/L (ref 4.26–5.60)
RED CELL DISTRIBUTION WIDTH: 13.3 % (ref 12.2–15.2)
WBC ADJUSTED: 11.7 10*9/L — ABNORMAL HIGH (ref 3.6–11.2)

## 2023-03-22 LAB — BETA HYDROXYBUTYRATE: BETA-HYDROXYBUTYRATE: 1.22 mmol/L — ABNORMAL HIGH (ref 0.02–0.27)

## 2023-03-22 NOTE — Unmapped (Signed)
Bed: 07  Expected date:   Expected time:   Means of arrival: Car  Comments:

## 2023-03-22 NOTE — Unmapped (Signed)
 [facility]  Emergency Department Provider Note    ED Clinical Impression     Final diagnoses:   Pyelonephritis (Primary)   Hematuria, unspecified type       HPI, ED Course, Assessment and Plan     Initial Clinical Impression:    March 22, 2023 11:55 PM   Shawn Walls is a 48 y.o. male with past medical history of T2DM, paroxysmal SVT, mild asthma, and OSAp presenting with flank pain. The patient reports 2-3 weeks ago he noticed hematuria that initially resolved on its own after one day. He states a few days later, his hematuria came back and was also passing blood clots for 2-3 days. He denies any dysuria, however does endorse pressure in his penile gland as he finishes urinating. He then developed right flank pain that has been progressively worsening with associated subjective fevers, chills, generalized body aches, and muscle pains. He reports the last time he had fevers was 2 days ago. No history of prior UTIs. Additionally, patient endorses elevated blood sugar levels in the 300s, even  despite not eating carbs for the last 6 days. Patient denies sexual activity since last year. Denies dysuria, testicular pain/swelling, constipation, diarrhea, chest pain, or shortness of breath.    Patient is well-appearing, hemodynamically stable, afebrile.  On physical exam lungs are clear to auscultation bilaterally without any wheezing or crackles.  Abdomen is soft, nontender, nondistended.  No costovertebral tenderness.  No suprapubic tenderness.  Extremities are warm and well-perfused.    BP 134/81  - Pulse 84  - Temp 36.7 ??C (98.1 ??F) (Oral)  - Resp 14  - Ht 175.3 cm (5' 9)  - Wt (!) 181.4 kg (400 lb)  - SpO2 97%  - BMI 59.07 kg/m??     Medical Decision Making  Differential diagnosis includes but is not limited to acute cystitis, pyelonephritis, renal calculi, ureteral stone.  Lower suspicion for sexually transmitted infection as patient is not sexually active.  Patient without any testicular pain or swelling so low concern for epididymitis.  Will get urinalysis, baseline labs and CT abdomen pelvis to evaluate for stones.  Will give IV fluids for hyperglycemia.  Further ED updates and updates to plan as per ED Course below:    Patient with UA concerning for urinary tract infection.  Will give first dose of ceftriaxone.  Patient does have ketones in his urine.  No evidence of DKA on labs.  Patient states that he is doing a keto diet.  Mild leukocytosis to 11.7.  Normal kidney function, normal liver function.       CT abdomen pelvis negative for nephrolithiasis or ureterolithiasis.  There is some inflammation around the ureter concerning for passed stone or an ascending infection.  Updated patient all test results.  Made outpatient referral for urology.  Prescribed patient Keflex for urinary tract infection.  Patient will follow-up with urologist and primary care provider.  Patient stable for outpatient management with strict return precautions.      External Records Reviewed: I have reviewed recent and relevant previous record, including: Outpatient notes - 12/26/2022 Internal Medicine Office Visit Note for past medical history.    Independent Interpretation of Studies: I have independently interpreted the following studies:  CT abdomen and pelvis with stranding along the course of the right ureter.  Mild bladder wall thickening which can be seen in cystitis.  Hepatic steatosis with mild splenomegaly.    Discussion of Management With Other Providers or Support Staff: I discussed the  management of this patient with the:  Dr. Norlene Campbell, attending provider    Considerations Regarding Disposition/Escalation of Care and Critical Care:  Patient appropriate for outpatient management with strict return precautions.    Social Determinants that significantly affected care: None Available        Social Drivers of Health with Concerns     Tobacco Use: High Risk (03/22/2023)    Patient History     Smoking Tobacco Use: Former     Smokeless Tobacco Use: Current     Passive Exposure: Past   Physical Activity: Not on file   Stress: Not on file   Interpersonal Safety: Not on file   Social Connections: Not on file   Internet Connectivity: Not on file     _____________________________________________________________________    The case was discussed with the attending physician who is in agreement with the above assessment and plan    Past History     PAST MEDICAL HISTORY/PAST SURGICAL HISTORY:   Past Medical History:   Diagnosis Date    Asthma     as child, prn albuterol    Diabetes mellitus (CMS-HCC)     HLD (hyperlipidemia)     Hypertension 05/25/2022    Morbid obesity with BMI of 60.0-69.9, adult (CMS-HCC)     Pneumonia 12/2018    PONV (postoperative nausea and vomiting)     Sleep apnea        Past Surgical History:   Procedure Laterality Date    PR NASAL/SINUS ENDOSCOPY,RMV TISS MAXILL SINUS Bilateral 06/19/2019    Procedure: NASAL/SINUS ENDOSCOPY, SURGICAL WITH MAXILLARY ANTROSTOMY; WITH REMOVAL OF TISSUE FROM MAXILLARY SINUS;  Surgeon: Fanny Bien, MD;  Location: MAIN OR [facility];  Service: ENT    PR NASAL/SINUS NDSC TOT W/SPHENDT W/SPHEN TISS RMVL Bilateral 06/19/2019    Procedure: NASAL/SINUS ENDOSCOPY, SURGICAL WITH ETHMOIDECTOMY; TOTAL (ANTERIOR AND POSTERIOR), INCLUDING SPHENOIDOTOMY, WITH REMOVAL OF TISSUE FROM THE SPHENOID SINUS;  Surgeon: Fanny Bien, MD;  Location: MAIN OR [facility];  Service: ENT    PR NASAL/SINUS NDSC W/RMVL TISS FROM FRONTAL SINUS Bilateral 06/19/2019    Procedure: NASAL/SINUS ENDOSCOPY, SURGICAL, WITH FRONTAL SINUS EXPLORATION, INCLUDING REMOVAL OF TISSUE FROM FRONTAL SINUS, WHEN PERFORMED;  Surgeon: Fanny Bien, MD;  Location: MAIN OR [facility];  Service: ENT    PR REPAIR OF NASAL SEPTUM Bilateral 06/19/2019    Procedure: SEPTOPLASTY OR SUBMUCOUS RESECTION, WITH OR WITHOUT CARTILAGE SCORING, CONTOURING OR REPLACEMENT WITH GRAFT;  Surgeon: Fanny Bien, MD;  Location: MAIN OR [facility];  Service: ENT    PR STEREOTACTIC COMP ASSIST PROC,CRANIAL,EXTRADURAL Bilateral 06/19/2019    Procedure: STEREOTACTIC COMPUTER-ASSISTED (NAVIGATIONAL) PROCEDURE; CRANIAL, EXTRADURAL;  Surgeon: Fanny Bien, MD;  Location: MAIN OR [facility];  Service: ENT    WISDOM TOOTH EXTRACTION         MEDICATIONS:   No current facility-administered medications for this encounter.    Current Outpatient Medications:     albuterol HFA 90 mcg/actuation inhaler, Inhale 2 puffs every six (6) hours as needed for wheezing or shortness of breath. (Patient not taking: Reported on 06/19/2022), Disp: 18 g, Rfl: 6    ascorbic acid, vitamin C, (VITAMIN C) 1000 MG tablet, Take 1 tablet (1,000 mg total) by mouth daily. (Patient not taking: Reported on 06/19/2022), Disp: , Rfl:     blood sugar diagnostic (GLUCOSE BLOOD) Strp, Use 1 strip to check blood sugar once daily, Disp: 50 strip, Rfl: 11    blood-glucose meter kit, Use to check blood sugar as directed., Disp: 1 each, Rfl: 11  blood-glucose meter Misc, Use as instructed, Disp: 1 each, Rfl: 0    cephalexin (KEFLEX) 500 MG capsule, Take 2 capsules (1,000 mg total) by mouth two (2) times a day for 10 days., Disp: 40 capsule, Rfl: 0    cholecalciferol, vitamin D3, (VITAMIN D3 ORAL), Take by mouth. (Patient not taking: Reported on 06/19/2022), Disp: , Rfl:     glipiZIDE (GLUCOTROL XL) 10 MG 24 hr tablet, Take 1 tablet (10 mg total) by mouth daily., Disp: 90 tablet, Rfl: 3    lancets 30 gauge Misc, Use 1 lancet to check blood sugar once daily, Disp: 100 each, Rfl: 11    lancets 30 gauge Misc, Use 1 daily in the morning before breakfast., Disp: 100 each, Rfl: 3    lancing device Misc, use as directed, Disp: 1 each, Rfl: 0    metFORMIN (GLUCOPHAGE) 1000 MG tablet, Take 1 tablet (1,000 mg total) by mouth in the morning and 1 tablet (1,000 mg total) in the evening. Take with meals., Disp: 180 tablet, Rfl: 3    SELENIUM ORAL, Take 1 tablet by mouth daily.  (Patient not taking: Reported on 06/19/2022), Disp: , Rfl:     syringe with needle 3 mL 18 x 1 1/2 Syrg, Use 1 each to inject testosterone once weekly as directed., Disp: 100 each, Rfl: 3    testosterone cypionate (DEPOTESTOTERONE CYPIONATE) 200 mg/mL injection, Inject 0.5 mL (100 mg total) into the muscle once a week. Discard remaining 0.50mL., Disp: 4 mL, Rfl: 5    UNABLE TO FIND, Vitamin B3 Non-Flush, Disp: , Rfl:     valsartan (DIOVAN) 40 MG tablet, Take 1 tablet (40 mg total) by mouth daily., Disp: 90 tablet, Rfl: 3    zinc gluconate 50 mg tablet, Take 1 tablet (50 mg total) by mouth daily. (Patient not taking: Reported on 06/19/2022), Disp: , Rfl:     ALLERGIES:   Penicillins and Penicillin    SOCIAL HISTORY:   Social History     Tobacco Use    Smoking status: Former     Current packs/day: 0.00     Average packs/day: 2.0 packs/day for 10.0 years (20.0 ttl pk-yrs)     Types: Cigarettes     Start date: 06/29/1996     Quit date: 06/30/2006     Years since quitting: 16.7     Passive exposure: Past    Smokeless tobacco: Current     Types: Chew   Substance Use Topics    Alcohol use: Never       FAMILY HISTORY:  Family History   Problem Relation Age of Onset    Anesthesia problems Neg Hx     Bleeding Disorder Neg Hx         Review of Systems     A review of systems was performed and relevant portions were as noted above in HPI     Physical Exam     VITAL SIGNS:    BP 134/81  - Pulse 84  - Temp 36.7 ??C (98.1 ??F) (Oral)  - Resp 14  - Ht 175.3 cm (5' 9)  - Wt (!) 181.4 kg (400 lb)  - SpO2 97%  - BMI 59.07 kg/m??         Constitutional:   Alert and oriented.   Head:   Normocephalic and atraumatic  Eyes:   Conjunctivae are normal, EOMI, PERRL  ENT:   No notable congestion, Mucous membranes moist, External ears normal, no notable stridor  Cardiovascular:  Rate as vitals above. Appears warm and well perfused  Respiratory:   Normal respiratory effort. Breath sounds are normal.  Gastrointestinal:   Soft, non-distended, and nontender.   Genitourinary:   Deferred  Musculoskeletal:    Normal range of motion in all extremities. No tenderness or edema noted in B/L lower extremities  Neurologic:   No gross focal neurologic deficits beyond baseline are appreciated.  Skin:   Skin is warm, dry and intact.       Radiology     CT Abdomen Pelvis Wo Contrast   Final Result   No ureterolithiasis. There is stranding along the course of the right ureter may represent a recently passed stone versus ascending infection.      Mild bladder wall thickening which can be seen in cystitis. Recommend correlation with UA.      Hepatic steatosis and mild splenomegaly.             Labs     Labs Reviewed   COMPREHENSIVE METABOLIC PANEL - Abnormal; Notable for the following components:       Result Value    Chloride 94 (*)     Creatinine 0.69 (*)     Glucose 266 (*)     Albumin 3.3 (*)     All other components within normal limits   BETA HYDROXYBUTYRATE - Abnormal; Notable for the following components:    BETAHYDRO 1.22 (*)     All other components within normal limits   HEMOGLOBIN A1C - Abnormal; Notable for the following components:    Hemoglobin A1C 14.0 (*)     All other components within normal limits    Narrative:     Screening or Diagnosis of Diabetes Mellitus*                   A1c Reference Interval        Interpretation                  4.8 - 5.6                     Normal                  5.7 - 6.4                     Dysglycemia                  >6.4                          Diabetes Mellitus                                    *Not recommended for diagnosis of diabetes in children with Cystic Fibrosis or with symptoms suggestive of acute onset type 1 diabetes.                                                        A1c Glycemic Goal: <7.0 %                                    **  Goals should be individualized; more or less stringent A1c glycemic goals may be appropriate for individual patients.                   (Adopted from: 2020 ADA Standards of Medical Care In Diabetes)                      POCT GLUCOSE, INTERFACED - Abnormal; Notable for the following components:    Glucose, POC 248 (*)     All other components within normal limits   CBC W/ AUTO DIFF - Abnormal; Notable for the following components:    WBC 11.7 (*)     Absolute Monocytes 1.1 (*)     Absolute Basophils 0.2 (*)     All other components within normal limits   URINALYSIS WITH MICROSCOPY WITH CULTURE REFLEX PERFORMABLE - Abnormal; Notable for the following components:    Leukocyte Esterase, UA Large (*)     Glucose, UA >1000 mg/dL (*)     Ketones, UA 40 mg/dL (*)     Blood, UA Trace (*)     WBC, UA 60 (*)     Mucus, UA Rare (*)     All other components within normal limits   URINE CULTURE   CBC W/ DIFFERENTIAL    Narrative:     The following orders were created for panel order CBC w/ Differential.                  Procedure                               Abnormality         Status                                     ---------                               -----------         ------                                     CBC w/ [id number]]         Abnormal            Final result                                                 Please view results for these tests on the individual orders.   URINALYSIS WITH MICROSCOPY WITH CULTURE REFLEX    Narrative:     The following orders were created for panel order Urinalysis with Microscopy with Culture Reflex.                  Procedure                               Abnormality         Status                                     ---------                               -----------         ------  Urinalysis with Microsc.Marland KitchenMarland Kitchen[[id number]]  Abnormal            Final result                                                 Please view results for these tests on the individual orders.   POCT GLUCOSE-RN OBTAIN         Pertinent labs & imaging results that were available during my care of the patient were reviewed by me and considered in my medical decision making (see chart for details).    Please note- This chart has been created using [employer]. Chart creation errors have been sought, but may not always be located and such creation errors, especially pronoun confusion, do NOT reflect on the standard of medical care.    Documentation assistance was provided by Altamese Cabal, Scribe, on March 23, 2023 at 12:06 AM for Nena Polio, MD.    Documentation assistance was provided by the scribe in my presence.  The documentation recorded by the scribe has been reviewed by me and accurately reflects the services I personally performed.      Lucretia Roers, MD  Resident  03/23/23 [phone]

## 2023-03-22 NOTE — Unmapped (Signed)
 Pt states right flank pain and blood in urine X 1 week. Unable to get blood sugar down. Staying in 300's.

## 2023-03-23 LAB — HEMOGLOBIN A1C
ESTIMATED AVERAGE GLUCOSE: 355 mg/dL
HEMOGLOBIN A1C: 14 % — ABNORMAL HIGH (ref 4.8–5.6)

## 2023-03-23 MED ORDER — CEPHALEXIN 500 MG CAPSULE
ORAL_CAPSULE | Freq: Two times a day (BID) | ORAL | 0 refills | 10.00 days | Status: CP
Start: 2023-03-23 — End: 2023-04-02
  Filled 2023-03-27: qty 40, 10d supply, fill #0

## 2023-03-23 MED ADMIN — cefTRIAXone (ROCEPHIN) 1 g in sodium chloride 0.9 % (NS) 100 mL IVPB-MBP: 1 g | INTRAVENOUS | @ 04:00:00 | Stop: 2023-03-23

## 2023-03-23 MED ADMIN — sodium chloride 0.9% (NS) bolus 1,000 mL: 1000 mL | INTRAVENOUS | @ 04:00:00 | Stop: 2023-03-23

## 2023-03-25 NOTE — Unmapped (Signed)
 Urine Culture  Order: [id number] - Reflex for Order [id number]   Status: Final result      Clean Catch; Urine  Urine Culture, Comprehensive   50,000 to 100,000 CFU/mL Escherichia coli     Patient with a positive urine culture. Patient was started on cephalexin 1,000 mg Oral 2 times a day (standard) prior to dispo from ED which is susceptible. No further action needed.

## 2023-03-27 ENCOUNTER — Ambulatory Visit: Admit: 2023-03-27 | Discharge: 2023-03-28 | Payer: PRIVATE HEALTH INSURANCE

## 2023-03-27 DIAGNOSIS — R319 Hematuria, unspecified: Principal | ICD-10-CM

## 2023-03-27 DIAGNOSIS — R059 Cough, unspecified type: Principal | ICD-10-CM

## 2023-03-27 DIAGNOSIS — E1165 Type 2 diabetes mellitus with hyperglycemia: Principal | ICD-10-CM

## 2023-03-27 DIAGNOSIS — J069 Acute upper respiratory infection, unspecified: Principal | ICD-10-CM

## 2023-03-27 DIAGNOSIS — N390 Urinary tract infection, site not specified: Principal | ICD-10-CM

## 2023-03-27 DIAGNOSIS — E119 Type 2 diabetes mellitus without complications: Principal | ICD-10-CM

## 2023-03-27 MED ORDER — LANCETS
Freq: Every day | 3 refills | 0.00 days | Status: CP
Start: 2023-03-27 — End: ?
  Filled 2023-03-27: qty 100, 100d supply, fill #0

## 2023-03-27 MED ORDER — BLOOD GLUCOSE TEST STRIPS
ORAL_STRIP | 11 refills | 0.00 days | Status: CP
Start: 2023-03-27 — End: 2024-03-26
  Filled 2023-03-27: qty 50, 50d supply, fill #0

## 2023-03-27 MED ORDER — BLOOD-GLUCOSE METER KIT WRAPPER
11 refills | 0.00 days | Status: CP
Start: 2023-03-27 — End: 2024-03-26
  Filled 2023-03-27: qty 1, 30d supply, fill #0

## 2023-03-27 MED ORDER — BENZONATATE 200 MG CAPSULE
ORAL_CAPSULE | Freq: Three times a day (TID) | ORAL | 0 refills | 7.00 days | Status: CP | PRN
Start: 2023-03-27 — End: 2023-04-03
  Filled 2023-03-27: qty 21, 7d supply, fill #0

## 2023-03-27 NOTE — Unmapped (Signed)
 [facility] SAME DAY CLINIC NOTE     03/27/2023    PCP: Roma Schanz, MD     Assessment and Plan    Shawn Walls was seen today for follow-up.    Diagnoses and all orders for this visit:    URI  5 days of cough and congestion. No fever/chills/systemic symptoms. Lungs clear.  -Recommend symptomatic management w/ Tessalon, Mucinex, saline nasal spray    UTI  UA with large leuk est, Ucx w/ E coli. S/P 1 dose of ceftriaxone in the ED 3/14. Symptoms resolved, but did not pick up oral antibiotics as prescribed by ED.  -Encouraged to pick up/finish cephalexin dose as prescribed by the ED    T2DM  Obesity  A1C of 14.0 3/14. Was recently prescribed metformin and glipizide, but is not taking these. Desires to try dietary modification first - carnivore diet.  -Encouraged to take metformin and glipizide as prescribed.  -Encouraged continued dietary modification: home-cooked meals, limiting simple carbohydrates, increasing vegetable intake. Provided with handouts on diabetic dietary modifications.  -Counseled on dangers of uncontrolled diabetes, including ESRD/dialysis, blindness, foot amputation.  -Sent glucometer, test strips.  -PCP follow up w/ Dr. Hulan Fess scheduled 3/21, 10:00 am.    Patient was seen and discussed with Dr. Jon Billings who is in agreement with the assessment and plan as outlined above.     Subjective    Problem List:  Patient Active Problem List   Diagnosis    Morbid obesity (CMS-HCC)    Buried penis    Obstructive sleep apnea    Chronic sinusitis    Nasal obstruction    Diabetes mellitus (CMS-HCC)    Morbid obesity with body mass index of 40.0-49.9 (CMS-HCC)    Left renal stone    Type 2 diabetes mellitus with hyperglycemia (CMS-HCC)    Mild intermittent asthma without complication    Acquired phimosis of penis    Candidal balanitis    Palpitations    Panic disorder    Paroxysmal supraventricular tachycardia (CMS-HCC)    Strep throat    Hypertension       HPI  Shawn Walls is a 48 y.o. year old male with the above problem list who presents to the same day clinic for   Chief Complaint   Patient presents with    Follow-up     Itchy throat with cough for a week.     Presenting with 5-6 days of nasal congestion, cough, itchiness. No fevers, chills, night sweats. 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    Presented to ED 5 days ago w/ flank pain, hematuria. Labs normal w/ exception of hyperglycemia, A1C 14.0. UA w/ large leuk est, glucosuria. Ucx w/ E coli. CT A/P negative for stone, showed possible ascending ureteral infxn, prescribed course of Kelfex (plans on picking this up today). No residual symptoms from this today    Recently (2 weeks ago) was prescribed metformin, glipizide. He has not been taking this, as he desires to try dietary modification first.    Meds and allergies were reviewed in Epic    ROS: 10 point ROS was performed and is otherwise negative other than mentioned in the HPI    Objective  PE:  Vitals:    03/27/23 1300   BP: 134/92   Pulse: 85   Resp: 18   Temp: 36.1 ??C (97 ??F)   SpO2: 98%     General: well-appearing in NAD  Eyes: EOMI, sclera clear   ENT: OP clear  w/o erythema or exudate  CV: regular, no murmurs  Resp: CTAB, no wheezes or crackles, normal WOB  GI: nondistended  MSK: full ROM, no deformity noted  Skin: clean and dry, no rashes or lesions noted  Ext: no cyanosis/clubbing/edema  Neuro: alert, follows commands.     Procedure: None  See procedure note from this encounter    _______________________________________________________________________________________    Same Day Metric Tracker:    Same Day Metric Tracker:  Referral source for today's visit:  General Internal Medicine    Referral to other outpatient urgent services? N/A    Did today's visit result in referral to ED or direct admission? No

## 2023-03-27 NOTE — Unmapped (Signed)
 Addended by: Percival Spanish on: 03/27/2023 02:06 PM     Modules accepted: Orders

## 2023-03-27 NOTE — Unmapped (Signed)
 I saw and evaluated the patient, participating in the key portions of the service.  I reviewed the resident???s note.  I agree with the resident???s findings and plan. Andres Shad, MD

## 2023-03-27 NOTE — Unmapped (Addendum)
-  For your cough: I have sent benzonatate (Tessalon) to your pharmacy to e used as needed.  -For your congestion: I recommend saline nasal spray, and Mucinex (guaifenesin) as needed.  -Please take the course of antibiotics as prescribed for your urinary tract infection.  -It is important to take your diabetes medications, and follow up with your primary care physician regarding your diabetes. We have scheduled an appointment for you

## 2023-03-29 ENCOUNTER — Ambulatory Visit: Admit: 2023-03-29 | Payer: PRIVATE HEALTH INSURANCE

## 2023-07-09 ENCOUNTER — Emergency Department
Admit: 2023-07-09 | Discharge: 2023-07-09 | Disposition: A | Discharge status: Home / Self Care | Payer: Medicaid (Managed Care)

## 2023-07-09 DIAGNOSIS — T24211A Burn of second degree of right thigh, initial encounter: Principal | ICD-10-CM

## 2023-07-09 DIAGNOSIS — S90512A Abrasion, left ankle, initial encounter: Principal | ICD-10-CM

## 2023-07-09 DIAGNOSIS — S93402A Sprain of unspecified ligament of left ankle, initial encounter: Principal | ICD-10-CM

## 2023-07-09 DIAGNOSIS — M79605 Pain in left leg: Principal | ICD-10-CM

## 2023-07-09 MED ADMIN — silver sulfADIAZINE (SILVADENE, SSD) 1 % cream: TOPICAL | @ 20:00:00 | Stop: 2023-07-09
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
